# Patient Record
Sex: Male | Born: 1990 | Race: Black or African American | Hispanic: No | Marital: Single | Smoking: Current every day smoker
Health system: Southern US, Community
[De-identification: ages and names within clinical notes are randomized; demographics above are authoritative.]

## PROBLEM LIST (undated history)

## (undated) DIAGNOSIS — I10 Essential (primary) hypertension: Secondary | ICD-10-CM

## (undated) DIAGNOSIS — E109 Type 1 diabetes mellitus without complications: Secondary | ICD-10-CM

## (undated) DIAGNOSIS — M549 Dorsalgia, unspecified: Secondary | ICD-10-CM

## (undated) DIAGNOSIS — G8929 Other chronic pain: Secondary | ICD-10-CM

## (undated) DIAGNOSIS — S21339A Puncture wound without foreign body of unspecified front wall of thorax with penetration into thoracic cavity, initial encounter: Secondary | ICD-10-CM

## (undated) DIAGNOSIS — W3400XA Accidental discharge from unspecified firearms or gun, initial encounter: Secondary | ICD-10-CM

## (undated) HISTORY — DX: Essential (primary) hypertension: I10

## (undated) HISTORY — PX: ABDOMINAL SURGERY: SHX537

---
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
## 2009-11-29 ENCOUNTER — Emergency Department (HOSPITAL_COMMUNITY): Admission: EM | Admit: 2009-11-29 | Discharge: 2009-11-29 | Payer: Self-pay | Admitting: Emergency Medicine
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
## 2011-01-24 NOTE — Discharge Summary (Signed)
   NAME:  Andrew Morales, Andrew Morales                         ACCOUNT NO.:  [id number]   MEDICAL RECORD NO.:  [id number]                   PATIENT TYPE:  INP   LOCATION:  6153                                 FACILITY:  [facility]   PHYSICIAN:  Pablo Ledger, M.D.               DATE OF BIRTH:  1991/02/14   DATE OF ADMISSION:  11/04/2002  DATE OF DISCHARGE:  11/08/2002                                 DISCHARGE SUMMARY   DISCHARGE DIAGNOSES:  1. Type 2 diabetes mellitus.  2. Obesity.  3. Noncompliance.   DISCHARGE MEDICATIONS:  Glucophage 150 mg p.o. b.i.d.   DISCHARGE INSTRUCTIONS:  Instructions to check CBGs before meals.   FOLLOWUP:  The patient will follow up with Dr. Nani Gasser on March  10th at 3:30 p.m.   HOSPITAL COURSE:  [age]-year-old boy with a history of diabetes and  __________ history of polyuria.  The patient had been previously diagnosed  in 2001 at [facility] in [place] and was on insulin and  Glucophage.  Insulin was discontinued after a couple of weeks and the  patient was continued on Glucophage.  The patient decided at some point to  discontinue taking that medication and did not follow up with the primary  care Hilding Quintanar.  The patient's blood sugar on admission was 418 and the  patient was started with IV hydration and his pH was 7.388 at that time.  Glucophage was started at 500 mg p.o. b.i.d. and increased after several  days to 850 mg p.o. b.i.d. without any GI side-effects.  BUN and creatinine  were 10 and 0.8, respectively upon admission, and glycosylated hemoglobin  was 11.9 upon admission.  The patient did well with an increased Glucophage  regimen.  The patient was covered with NPH of 5 units in the a.m. and 5  units in the p.m. and was discontinued as of discharge and will follow up in  one week with CBGs.     Nani Gasser, M.D.                  Pablo Ledger, M.D.    CM/MEDQ  D:  11/08/2002  T:  11/08/2002  Job:  [id number]   cc:    Nani Gasser, M.D.  [address] [place], [place] [address]  Fax: [phone]

## 2011-04-20 ENCOUNTER — Emergency Department (HOSPITAL_COMMUNITY)
Admission: EM | Admit: 2011-04-20 | Discharge: 2011-04-20 | Disposition: A | Discharge status: Home / Self Care | Payer: Self-pay | Attending: Emergency Medicine | Admitting: Emergency Medicine

## 2011-04-20 DIAGNOSIS — L723 Sebaceous cyst: Secondary | ICD-10-CM | POA: Insufficient documentation

## 2011-04-20 DIAGNOSIS — F17200 Nicotine dependence, unspecified, uncomplicated: Secondary | ICD-10-CM | POA: Insufficient documentation

## 2011-04-20 MED ORDER — LIDOCAINE HCL 2 % IJ SOLN
INTRAMUSCULAR | Status: AC
  Filled 2011-04-20: qty 1

## 2011-04-20 MED ORDER — DOXYCYCLINE HYCLATE 100 MG PO CAPS: 100.0000 mg | ORAL_CAPSULE | Freq: Two times a day (BID) | ORAL | Status: AC

## 2011-04-20 MED ORDER — HYDROCODONE-ACETAMINOPHEN 5-325 MG PO TABS: ORAL_TABLET | ORAL | Status: DC

## 2011-04-20 NOTE — ED Provider Notes (Signed)
History     CSN: [id number] Arrival date & time: 04/20/2011  5:55 PM  Chief Complaint  Patient presents with  . Cyst  . Otalgia   Patient is a 20 y.o. male presenting with ear pain. The history is provided by the patient. No language interpreter was used.  Otalgia This is a new problem. There is pain in the left ear. The problem occurs constantly. The problem has been gradually worsening. There has been no fever. The pain is at a severity of 7/10. Pertinent negatives include no ear discharge. His past medical history does not include hearing loss.    History reviewed. No pertinent past medical history.  Past Surgical History  Procedure Date  . Abdominal surgery     No family history on file.  History  Substance Use Topics  . Smoking status: Current Everyday Smoker  . Smokeless tobacco: Not on file  . Alcohol Use: No      Review of Systems  HENT: Positive for ear pain. Negative for ear discharge.   All other systems reviewed and are negative.    Physical Exam  BP 147/84  Pulse 83  Temp(Src) 98.4 F (36.9 C) (Oral)  Resp 18  Ht 6\' 2"  (1.88 m)  Wt 214 lb (97.07 kg)  BMI 27.48 kg/m2  SpO2 100%  Physical Exam  Nursing note and vitals reviewed. Constitutional: He is oriented to person, place, and time. Vital signs are normal. He appears well-developed and well-nourished.  HENT:  Head: Normocephalic and atraumatic.  Right Ear: External ear normal.  Left Ear: Hearing normal. There is tenderness. No drainage or swelling. No decreased hearing is noted.  Ears:  Nose: Nose normal.  Mouth/Throat: No oropharyngeal exudate.       ~ 1 cm diam area of swelling to L auditory meatus.  + PT  Eyes: Conjunctivae and EOM are normal. Pupils are equal, round, and reactive to light. Right eye exhibits no discharge. Left eye exhibits no discharge. No scleral icterus.  Neck: Normal range of motion. Neck supple. No JVD present. No tracheal deviation present. No thyromegaly present.    Cardiovascular: Normal rate, regular rhythm, normal heart sounds, intact distal pulses and normal pulses.  Exam reveals no gallop and no friction rub.   No murmur heard. Pulmonary/Chest: Effort normal and breath sounds normal. No stridor. No respiratory distress. He has no wheezes. He has no rales. He exhibits no tenderness.  Abdominal: Soft. Normal appearance and bowel sounds are normal. He exhibits no distension and no mass. There is no tenderness. There is no rebound and no guarding.  Musculoskeletal: Normal range of motion. He exhibits no edema and no tenderness.  Lymphadenopathy:    He has no cervical adenopathy.  Neurological: He is alert and oriented to person, place, and time. He has normal reflexes. No cranial nerve deficit. Coordination normal. GCS eye subscore is 4. GCS verbal subscore is 5. GCS motor subscore is 6.  Reflex Scores:      Tricep reflexes are 2+ on the right side and 2+ on the left side.      Bicep reflexes are 2+ on the right side and 2+ on the left side.      Brachioradialis reflexes are 2+ on the right side and 2+ on the left side.      Patellar reflexes are 2+ on the right side and 2+ on the left side.      Achilles reflexes are 2+ on the right side and 2+ on the left  side. Skin: Skin is warm and dry. No rash noted. He is not diaphoretic.  Psychiatric: He has a normal mood and affect. His speech is normal and behavior is normal. Judgment and thought content normal. Cognition and memory are normal.    ED Course  INCISION AND DRAINAGE Date/Time: 04/20/2011 6:40 PM Performed by: Worthy Rancher Authorized by: Geoffery Lyons Consent: Verbal consent obtained. Written consent not obtained. Risks and benefits: risks, benefits and alternatives were discussed Consent given by: patient Patient understanding: patient states understanding of the procedure being performed Site marked: the operative site was not marked Patient identity confirmed: verbally with  patient Time out: Immediately prior to procedure a "time out" was called to verify the correct patient, procedure, equipment, support staff and site/side marked as required. Type: cyst Location: L auditory meatus. Anesthesia: local infiltration Local anesthetic: lidocaine 2% without epinephrine Anesthetic total: 1 ml Patient sedated: no Scalpel size: 11 Needle gauge: 25 ga. Incision type: single straight Complexity: simple Drainage: purulent Drainage amount: moderate Wound treatment: wound left open Patient tolerance: Patient tolerated the procedure well with no immediate complications.    MDM       Worthy Rancher, PA 04/20/11 1905

## 2011-04-20 NOTE — ED Notes (Signed)
Pt reports a cyst to his left ear.  Reports that it "popped up last night".  Pt reports severe pain to the area.

## 2011-04-20 NOTE — ED Provider Notes (Signed)
Medical screening examination/treatment/procedure(s) were performed by non-physician practitioner and as supervising physician I was immediately available for consultation/collaboration.  Brindy Higginbotham, MD 04/20/11 1928 

## 2011-06-06 LAB — GLUCOSE, CAPILLARY
Glucose-Capillary: 219 — ABNORMAL HIGH
Glucose-Capillary: 233 — ABNORMAL HIGH
Glucose-Capillary: 248 — ABNORMAL HIGH
Glucose-Capillary: 294 — ABNORMAL HIGH

## 2011-06-06 LAB — POCT I-STAT, CHEM 8
BUN: 14
Calcium, Ion: 1.07 — ABNORMAL LOW
TCO2: 20

## 2011-06-06 LAB — TYPE AND SCREEN
ABO/RH(D): B POS
Antibody Screen: NEGATIVE

## 2011-06-06 LAB — ABO/RH: ABO/RH(D): B POS

## 2011-06-06 LAB — CBC
HCT: 40.8
Hemoglobin: 14
MCV: 92.2
RBC: 4.43
WBC: 12.8

## 2011-06-06 LAB — URINE MICROSCOPIC-ADD ON

## 2011-06-06 LAB — BASIC METABOLIC PANEL
Chloride: 99
Potassium: 3.9
Sodium: 134 — ABNORMAL LOW

## 2011-06-06 LAB — URINALYSIS, ROUTINE W REFLEX MICROSCOPIC
Bilirubin Urine: NEGATIVE
Glucose, UA: >1000 — AB
Ketones, ur: 40 — AB
Leukocytes, UA: NEGATIVE
Protein, ur: 30 — AB

## 2011-06-06 LAB — RAPID URINE DRUG SCREEN, HOSP PERFORMED
Barbiturates: NOT DETECTED
Cocaine: NOT DETECTED
Opiates: NOT DETECTED
Tetrahydrocannabinol: POSITIVE — AB

## 2011-11-19 ENCOUNTER — Emergency Department (HOSPITAL_COMMUNITY)
Admission: EM | Admit: 2011-11-19 | Discharge: 2011-11-19 | Disposition: A | Discharge status: Home / Self Care | Payer: Self-pay | Attending: Emergency Medicine | Admitting: Emergency Medicine

## 2011-11-19 ENCOUNTER — Encounter (HOSPITAL_COMMUNITY): Payer: Self-pay | Admitting: *Deleted

## 2011-11-19 DIAGNOSIS — M79609 Pain in unspecified limb: Secondary | ICD-10-CM | POA: Insufficient documentation

## 2011-11-19 DIAGNOSIS — M7989 Other specified soft tissue disorders: Secondary | ICD-10-CM | POA: Insufficient documentation

## 2011-11-19 DIAGNOSIS — IMO0002 Reserved for concepts with insufficient information to code with codable children: Secondary | ICD-10-CM | POA: Insufficient documentation

## 2011-11-19 DIAGNOSIS — L0291 Cutaneous abscess, unspecified: Secondary | ICD-10-CM

## 2011-11-19 DIAGNOSIS — R599 Enlarged lymph nodes, unspecified: Secondary | ICD-10-CM | POA: Insufficient documentation

## 2011-11-19 MED ORDER — LIDOCAINE-EPINEPHRINE (PF) 1 %-1:200000 IJ SOLN
10.0000 mL | Freq: Once | INTRAMUSCULAR | Status: AC
  Administered 2011-11-19: 10 mL
  Filled 2011-11-19: qty 10

## 2011-11-19 MED ORDER — OXYCODONE-ACETAMINOPHEN 5-325 MG PO TABS
1.0000 | ORAL_TABLET | Freq: Once | ORAL | Status: AC
  Administered 2011-11-19: 1 via ORAL
  Filled 2011-11-19: qty 1

## 2011-11-19 MED ORDER — DOXYCYCLINE HYCLATE 100 MG PO CAPS: 100.0000 mg | ORAL_CAPSULE | Freq: Two times a day (BID) | ORAL | Status: AC

## 2011-11-19 MED ORDER — OXYCODONE-ACETAMINOPHEN 5-325 MG PO TABS: 1.0000 | ORAL_TABLET | ORAL | Status: AC | PRN

## 2011-11-19 NOTE — ED Notes (Signed)
Swollen area lt axilla.x 4 days

## 2011-11-19 NOTE — ED Provider Notes (Signed)
History     CSN: [id number]  Arrival date & time 11/19/11  1543   First MD Initiated Contact with Patient 11/19/11 1558      Chief Complaint  Patient presents with  . Abscess    (Consider location/radiation/quality/duration/timing/severity/associated sxs/prior treatment) HPI Comments: Patient presents with an abscess to his left axilla which is been present for the past 4 days.  It has not been draining, but he reports increasing pain swelling and redness which is now spreading to his upper arm.  He does have a history of a similar infection with an incision and drainage procedure last fall to his left ear.  Patient is a 21 y.o. male presenting with abscess. The history is provided by the patient.  Abscess  This is a new problem. The current episode started less than one week ago. The onset was gradual. The problem occurs continuously. The problem has been gradually worsening. The abscess is present on the left arm. The problem is moderate. The abscess is characterized by painfulness and swelling. Pertinent negatives include no fever, no vomiting, no congestion, no sore throat and no cough.    History reviewed. No pertinent past medical history.  Past Surgical History  Procedure Date  . Abdominal surgery     History reviewed. No pertinent family history.  History  Substance Use Topics  . Smoking status: Current Everyday Smoker  . Smokeless tobacco: Not on file  . Alcohol Use: No      Review of Systems  Constitutional: Negative for fever.  HENT: Negative for congestion, sore throat and neck pain.   Eyes: Negative.   Respiratory: Negative for cough, chest tightness and shortness of breath.   Cardiovascular: Negative for chest pain.  Gastrointestinal: Negative for nausea, vomiting and abdominal pain.  Genitourinary: Negative.   Musculoskeletal: Negative for joint swelling and arthralgias.  Skin: Positive for color change and wound. Negative for rash.  Neurological:  Negative for dizziness, weakness, light-headedness, numbness and headaches.  Hematological: Negative.   Psychiatric/Behavioral: Negative.     Allergies  Bee venom  Home Medications   Current Outpatient Rx  Name Route Sig Dispense Refill  . DOXYCYCLINE HYCLATE 100 MG PO CAPS Oral Take 1 capsule (100 mg total) by mouth 2 (two) times daily. 20 capsule 0  . HYDROCODONE-ACETAMINOPHEN 5-325 MG PO TABS  One po q 4-6 hrs prn pain 12 tablet 0  . OXYCODONE-ACETAMINOPHEN 5-325 MG PO TABS Oral Take 1 tablet by mouth every 4 (four) hours as needed for pain. 15 tablet 0    BP 140/79  Pulse 93  Temp(Src) 98.6 F (37 C) (Oral)  Resp 18  Ht 6\' 2"  (1.88 m)  Wt 202 lb (91.627 kg)  BMI 25.94 kg/m2  SpO2 100%  Physical Exam  Nursing note and vitals reviewed. Constitutional: He is oriented to person, place, and time. He appears well-developed and well-nourished.  HENT:  Head: Normocephalic and atraumatic.  Eyes: Conjunctivae are normal.  Neck: Normal range of motion.  Cardiovascular: Normal rate, regular rhythm, normal heart sounds and intact distal pulses.   Pulmonary/Chest: Effort normal and breath sounds normal. He has no wheezes.  Abdominal: Soft. Bowel sounds are normal. There is no tenderness.  Musculoskeletal: Normal range of motion.  Lymphadenopathy:    He has axillary adenopathy.       Left axillary: Lateral adenopathy present.       Left: No supraclavicular adenopathy present.  Neurological: He is alert and oriented to person, place, and time.  Skin: Skin  is warm and dry. There is erythema.       2 cm fluctuant very circumcised raised abscess left axilla with erythema noted on the medial left upper arm which ends just superior to elbow.  Psychiatric: He has a normal mood and affect.    ED Course  INCISION AND DRAINAGE Date/Time: 11/19/2011 4:20 PM Performed by: Jahni Paul L Authorized by: Candis Musa Consent: Verbal consent obtained. Risks and benefits: risks, benefits and  alternatives were discussed Consent given by: patient Time out: Immediately prior to procedure a "time out" was called to verify the correct patient, procedure, equipment, support staff and site/side marked as required. Type: abscess Body area: upper extremity Location details: left arm Anesthesia: local infiltration Local anesthetic: lidocaine 1% with epinephrine Anesthetic total: 6 ml Risk factor: underlying major vessel Scalpel size: 11 Incision type: single straight Complexity: simple Drainage: purulent Drainage amount: moderate Packing material: 1/4 in gauze Patient tolerance: Patient tolerated the procedure well with no immediate complications.   (including critical care time)   Labs Reviewed  WOUND CULTURE  CULTURE, ROUTINE-ABSCESS     1. Abscess and cellulitis       MDM  Patient to return in 3 days for packing removal and recheck of cellulitis.  Dressing was applied RN prior to discharge home.  He has been prescribed doxycycline and also Percocet for pain relief.  Wound culture was obtained and sent.        Candis Musa, PA 11/19/11 1738

## 2011-11-19 NOTE — Discharge Instructions (Signed)
Abscess An abscess (boil or furuncle) is an infected area that contains a collection of pus.  SYMPTOMS Signs and symptoms of an abscess include pain, tenderness, redness, or hardness. You may feel a moveable soft area under your skin. An abscess can occur anywhere in the body.  TREATMENT  A surgical cut (incision) may be made over your abscess to drain the pus. Gauze may be packed into the space or a drain may be looped through the abscess cavity (pocket). This provides a drain that will allow the cavity to heal from the inside outwards. The abscess may be painful for a few days, but should feel much better if it was drained.  Your abscess, if seen early, may not have localized and may not have been drained. If not, another appointment may be required if it does not get better on its own or with medications. HOME CARE INSTRUCTIONS   Only take over-the-counter or prescription medicines for pain, discomfort, or fever as directed by your caregiver.   Take your antibiotics as directed if they were prescribed. Finish them even if you start to feel better.   Keep the skin and clothes clean around your abscess.   If the abscess was drained, you will need to use gauze dressing to collect any draining pus. Dressings will typically need to be changed 3 or more times a day.   The infection may spread by skin contact with others. Avoid skin contact as much as possible.   Practice good hygiene. This includes regular hand washing, cover any draining skin lesions, and do not share personal care items.   If you participate in sports, do not share athletic equipment, towels, whirlpools, or personal care items. Shower after every practice or tournament.   If a draining area cannot be adequately covered:   Do not participate in sports.   Children should not participate in day care until the wound has healed or drainage stops.   If your caregiver has given you a follow-up appointment, it is very important  to keep that appointment. Not keeping the appointment could result in a much worse infection, chronic or permanent injury, pain, and disability. If there is any problem keeping the appointment, you must call back to this facility for assistance.  SEEK MEDICAL CARE IF:   You develop increased pain, swelling, redness, drainage, or bleeding in the wound site.   You develop signs of generalized infection including muscle aches, chills, fever, or a general ill feeling.   You have an oral temperature above 102 F (38.9 C).  MAKE SURE YOU:   Understand these instructions.   Will watch your condition.   Will get help right away if you are not doing well or get worse.  Document Released: 06/04/2005 Document Revised: 08/14/2011 Document Reviewed: 03/28/2008 [facility] Patient Information 2012 [place], [place]. Return here in 3 days to have your packing removed and for a recheck of your infection.  Take your medications as instructed.  You may take the oxycodone prescribed for pain relief.  This will make you drowsy - do not drive within 4 hours of taking this medication.

## 2011-11-19 NOTE — ED Notes (Signed)
Swelling and redness left axilla

## 2011-11-19 NOTE — ED Notes (Signed)
i and d with packing inserted by J.IdoL PA

## 2011-11-20 NOTE — ED Provider Notes (Signed)
Medical screening examination/treatment/procedure(s) were performed by non-physician practitioner and as supervising physician I was immediately available for consultation/collaboration.   Rain Wilhide L Helayna Dun, MD 11/20/11 1604 

## 2011-11-21 ENCOUNTER — Encounter (HOSPITAL_COMMUNITY): Payer: Self-pay

## 2011-11-21 ENCOUNTER — Emergency Department (HOSPITAL_COMMUNITY)
Admission: EM | Admit: 2011-11-21 | Discharge: 2011-11-21 | Disposition: A | Discharge status: Home / Self Care | Payer: Self-pay | Attending: Emergency Medicine | Admitting: Emergency Medicine

## 2011-11-21 DIAGNOSIS — L0291 Cutaneous abscess, unspecified: Secondary | ICD-10-CM

## 2011-11-21 DIAGNOSIS — Z4800 Encounter for change or removal of nonsurgical wound dressing: Secondary | ICD-10-CM | POA: Insufficient documentation

## 2011-11-21 DIAGNOSIS — F17200 Nicotine dependence, unspecified, uncomplicated: Secondary | ICD-10-CM | POA: Insufficient documentation

## 2011-11-21 DIAGNOSIS — IMO0002 Reserved for concepts with insufficient information to code with codable children: Secondary | ICD-10-CM | POA: Insufficient documentation

## 2011-11-21 NOTE — Discharge Instructions (Signed)
Abscess An abscess (boil or furuncle) is an infected area that contains a collection of pus.  SYMPTOMS Signs and symptoms of an abscess include pain, tenderness, redness, or hardness. You may feel a moveable soft area under your skin. An abscess can occur anywhere in the body.  TREATMENT  A surgical cut (incision) may be made over your abscess to drain the pus. Gauze may be packed into the space or a drain may be looped through the abscess cavity (pocket). This provides a drain that will allow the cavity to heal from the inside outwards. The abscess may be painful for a few days, but should feel much better if it was drained.  Your abscess, if seen early, may not have localized and may not have been drained. If not, another appointment may be required if it does not get better on its own or with medications. HOME CARE INSTRUCTIONS   Only take over-the-counter or prescription medicines for pain, discomfort, or fever as directed by your caregiver.   Take your antibiotics as directed if they were prescribed. Finish them even if you start to feel better.   Keep the skin and clothes clean around your abscess.   If the abscess was drained, you will need to use gauze dressing to collect any draining pus. Dressings will typically need to be changed 3 or more times a day.   The infection may spread by skin contact with others. Avoid skin contact as much as possible.   Practice good hygiene. This includes regular hand washing, cover any draining skin lesions, and do not share personal care items.   If you participate in sports, do not share athletic equipment, towels, whirlpools, or personal care items. Shower after every practice or tournament.   If a draining area cannot be adequately covered:   Do not participate in sports.   Children should not participate in day care until the wound has healed or drainage stops.   If your caregiver has given you a follow-up appointment, it is very important  to keep that appointment. Not keeping the appointment could result in a much worse infection, chronic or permanent injury, pain, and disability. If there is any problem keeping the appointment, you must call back to this facility for assistance.  SEEK MEDICAL CARE IF:   You develop increased pain, swelling, redness, drainage, or bleeding in the wound site.   You develop signs of generalized infection including muscle aches, chills, fever, or a general ill feeling.   You have an oral temperature above 102 F (38.9 C).  MAKE SURE YOU:   Understand these instructions.   Will watch your condition.   Will get help right away if you are not doing well or get worse.  Document Released: 06/04/2005 Document Revised: 08/14/2011 Document Reviewed: 03/28/2008 Exit[employer] Patient Information 2012 [employer].Abscess Care After An abscess (also called a boil or furuncle) is an infected area that contains a collection of pus. Signs and symptoms of an abscess include pain, tenderness, redness, or hardness, or you may feel a moveable soft area under your skin. An abscess can occur anywhere in the body. The infection may spread to surrounding tissues causing cellulitis. A cut (incision) by the surgeon was made over your abscess and the pus was drained out. Gauze may have been packed into the space to provide a drain that will allow the cavity to heal from the inside outwards. The boil may be painful for 5 to 7 days. Most people with a boil   do not have high fevers. Your abscess, if seen early, may not have localized, and may not have been lanced. If not, another appointment may be required for this if it does not get better on its own or with medications. HOME CARE INSTRUCTIONS   Only take over-the-counter or prescription medicines for pain, discomfort, or fever as directed by your caregiver.   When you bathe, soak and then remove gauze or iodoform packs at least daily or as directed by your caregiver. You may  then wash the wound gently with mild soapy water. Repack with gauze or do as your caregiver directs.  SEEK IMMEDIATE MEDICAL CARE IF:   You develop increased pain, swelling, redness, drainage, or bleeding in the wound site.   You develop signs of generalized infection including muscle aches, chills, fever, or a general ill feeling.   An oral temperature above 102 F (38.9 C) develops, not controlled by medication.  See your caregiver for a recheck if you develop any of the symptoms described above. If medications (antibiotics) were prescribed, take them as directed. Document Released: 03/13/2005 Document Revised: 08/14/2011 Document Reviewed: 11/08/2007 Exit[employer] Patient Information 2012 [employer]. 

## 2011-11-21 NOTE — ED Provider Notes (Signed)
History     CSN: [id number]  Arrival date & time 11/21/11  1831   First MD Initiated Contact with Patient 11/21/11 1911      Chief Complaint  Patient presents with  . Wound Check    (Consider location/radiation/quality/duration/timing/severity/associated sxs/prior treatment) HPI Comments: Patient returns to ED requesting recheck of an abscess and to have previously placed packing removed.  States the pain is improving and previous redness has greatly improved  Patient is a 21 y.o. male presenting with wound check. The history is provided by the patient. No language interpreter was used.  Wound Check  He was treated in the ED 2 to 3 days ago. Previous treatment in the ED includes I&D of abscess and oral antibiotics. Treatments since wound repair include oral antibiotics and regular soap and water washings. Fever duration: no fever. There has been colored discharge from the wound. The redness has improved. The swelling has improved. The pain has improved. He has no difficulty moving the affected extremity or digit.    History reviewed. No pertinent past medical history.  Past Surgical History  Procedure Date  . Abdominal surgery     No family history on file.  History  Substance Use Topics  . Smoking status: Current Everyday Smoker    Types: Cigarettes  . Smokeless tobacco: Not on file  . Alcohol Use: No      Review of Systems  Constitutional: Negative for fever and chills.  Gastrointestinal: Negative for nausea and vomiting.  Skin: Positive for wound. Negative for color change.  Hematological: Negative for adenopathy.  All other systems reviewed and are negative.    Allergies  Bee venom  Home Medications   Current Outpatient Rx  Name Route Sig Dispense Refill  . DOXYCYCLINE HYCLATE 100 MG PO CAPS Oral Take 1 capsule (100 mg total) by mouth 2 (two) times daily. 20 capsule 0  . OXYCODONE-ACETAMINOPHEN 5-325 MG PO TABS Oral Take 1 tablet by mouth every 4 (four)  hours as needed for pain. 15 tablet 0    BP 140/73  Pulse 92  Temp(Src) 98.7 F (37.1 C) (Oral)  Resp 16  Ht 6\' 2"  (1.88 m)  Wt 202 lb 12.8 oz (91.989 kg)  BMI 26.04 kg/m2  SpO2 99%  Physical Exam  Nursing note and vitals reviewed. Constitutional: He is oriented to person, place, and time. He appears well-developed and well-nourished. No distress.  HENT:  Head: Normocephalic and atraumatic.  Cardiovascular: Normal rate, regular rhythm and normal heart sounds.   Pulmonary/Chest: Effort normal and breath sounds normal. No respiratory distress.  Musculoskeletal: Normal range of motion. He exhibits no edema and no tenderness.  Neurological: He is alert and oriented to person, place, and time. He exhibits normal muscle tone. Coordination normal.  Skin: Skin is warm and dry.       Abscess to left axilla with previous I&D performed.  Packing in place with mild purulent drainage on the dressing.  Appears to be improving    ED Course  Procedures (including critical care time)   1. Abscess     I have removed the packing without difficulty. Patient tolerated procedure well.  Abscess is improving  MDM    Abscess seems to be improving. Erythema has improved  based on the skin marks made on previous visit.  Patient currently taking doxycycline have advised him to continue until medication course is completed   Patient / Family / Caregiver understand and agree with initial ED impression and plan with expectations  set for ED visit. Pt stable in ED with no significant deterioration in condition. Pt feels improved after observation and/or treatment in ED.    Medical screening examination/treatment/procedure(s) were performed by non-physician practitioner and as supervising physician I was immediately available for consultation/collaboration. Osvaldo Human, M.D.    Tammy L. Holton, [place] 11/24/11 1703  Carleene Cooper III, MD 11/25/11 1141

## 2011-11-21 NOTE — ED Notes (Signed)
Pt here for packing removal from left axillary

## 2011-11-22 LAB — CULTURE, ROUTINE-ABSCESS

## 2012-05-05 ENCOUNTER — Emergency Department (HOSPITAL_COMMUNITY): Payer: Self-pay

## 2012-05-05 ENCOUNTER — Encounter (HOSPITAL_COMMUNITY): Payer: Self-pay | Admitting: Emergency Medicine

## 2012-05-05 ENCOUNTER — Other Ambulatory Visit: Payer: Self-pay

## 2012-05-05 ENCOUNTER — Emergency Department (HOSPITAL_COMMUNITY)
Admission: EM | Admit: 2012-05-05 | Discharge: 2012-05-05 | Disposition: A | Discharge status: Home / Self Care | Payer: Self-pay | Attending: Emergency Medicine | Admitting: Emergency Medicine

## 2012-05-05 DIAGNOSIS — R059 Cough, unspecified: Secondary | ICD-10-CM | POA: Insufficient documentation

## 2012-05-05 DIAGNOSIS — Z7982 Long term (current) use of aspirin: Secondary | ICD-10-CM | POA: Insufficient documentation

## 2012-05-05 DIAGNOSIS — R079 Chest pain, unspecified: Secondary | ICD-10-CM | POA: Insufficient documentation

## 2012-05-05 DIAGNOSIS — M545 Low back pain, unspecified: Secondary | ICD-10-CM | POA: Insufficient documentation

## 2012-05-05 DIAGNOSIS — G8929 Other chronic pain: Secondary | ICD-10-CM

## 2012-05-05 DIAGNOSIS — F17200 Nicotine dependence, unspecified, uncomplicated: Secondary | ICD-10-CM | POA: Insufficient documentation

## 2012-05-05 DIAGNOSIS — R05 Cough: Secondary | ICD-10-CM | POA: Insufficient documentation

## 2012-05-05 HISTORY — DX: Puncture wound without foreign body of unspecified front wall of thorax with penetration into thoracic cavity, initial encounter: S21.339A

## 2012-05-05 HISTORY — DX: Dorsalgia, unspecified: M54.9

## 2012-05-05 HISTORY — DX: Accidental discharge from unspecified firearms or gun, initial encounter: W34.00XA

## 2012-05-05 HISTORY — DX: Other chronic pain: G89.29

## 2012-05-05 LAB — URINALYSIS, ROUTINE W REFLEX MICROSCOPIC
Glucose, UA: 500 mg/dL — AB
Hgb urine dipstick: NEGATIVE
Protein, ur: 30 mg/dL — AB
Urobilinogen, UA: 0.2 mg/dL (ref 0.0–1.0)

## 2012-05-05 LAB — CBC WITH DIFFERENTIAL/PLATELET
Basophils Absolute: 0 10*3/uL (ref 0.0–0.1)
Basophils Relative: 0 % (ref 0–1)
Eosinophils Absolute: 0 10*3/uL (ref 0.0–0.7)
Hemoglobin: 14.2 g/dL (ref 13.0–17.0)
MCH: 30.9 pg (ref 26.0–34.0)
MCHC: 35.4 g/dL (ref 30.0–36.0)
Monocytes Absolute: 0.2 10*3/uL (ref 0.1–1.0)
Monocytes Relative: 4 % (ref 3–12)
Neutrophils Relative %: 69 % (ref 43–77)
RDW: 12.1 % (ref 11.5–15.5)

## 2012-05-05 LAB — BASIC METABOLIC PANEL
BUN: 19 mg/dL (ref 6–23)
Creatinine, Ser: 0.83 mg/dL (ref 0.50–1.35)
GFR calc Af Amer: >90 mL/min (ref >90)
GFR calc non Af Amer: >90 mL/min (ref >90)
Glucose, Bld: 274 mg/dL — ABNORMAL HIGH (ref 70–99)
Potassium: 4.1 mEq/L (ref 3.5–5.1)

## 2012-05-05 LAB — URINE MICROSCOPIC-ADD ON

## 2012-05-05 MED ORDER — OXYCODONE-ACETAMINOPHEN 5-325 MG PO TABS
1.0000 | ORAL_TABLET | Freq: Once | ORAL | Status: AC
  Administered 2012-05-05: 1 via ORAL
  Filled 2012-05-05: qty 1

## 2012-05-05 MED ORDER — METHOCARBAMOL 500 MG PO TABS: 1000.0000 mg | ORAL_TABLET | Freq: Four times a day (QID) | ORAL | Status: AC | PRN

## 2012-05-05 MED ORDER — IBUPROFEN 400 MG PO TABS
400.0000 mg | ORAL_TABLET | Freq: Once | ORAL | Status: AC
  Administered 2012-05-05: 400 mg via ORAL
  Filled 2012-05-05: qty 1

## 2012-05-05 MED ORDER — HYDROCODONE-ACETAMINOPHEN 5-325 MG PO TABS: ORAL_TABLET | ORAL | Status: AC
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
## 2012-05-07 LAB — URINE CULTURE: Colony Count: 5000

## 2013-03-25 ENCOUNTER — Inpatient Hospital Stay (HOSPITAL_COMMUNITY)
Admission: EM | Admit: 2013-03-25 | Discharge: 2013-03-27 | DRG: 641 | Payer: PRIVATE HEALTH INSURANCE | Attending: Internal Medicine | Admitting: Internal Medicine

## 2013-03-25 ENCOUNTER — Encounter (HOSPITAL_COMMUNITY): Payer: Self-pay | Admitting: *Deleted

## 2013-03-25 DIAGNOSIS — N289 Disorder of kidney and ureter, unspecified: Secondary | ICD-10-CM | POA: Diagnosis present

## 2013-03-25 DIAGNOSIS — I10 Essential (primary) hypertension: Secondary | ICD-10-CM

## 2013-03-25 DIAGNOSIS — K920 Hematemesis: Secondary | ICD-10-CM | POA: Diagnosis present

## 2013-03-25 DIAGNOSIS — E119 Type 2 diabetes mellitus without complications: Secondary | ICD-10-CM | POA: Diagnosis present

## 2013-03-25 DIAGNOSIS — R112 Nausea with vomiting, unspecified: Secondary | ICD-10-CM | POA: Diagnosis present

## 2013-03-25 DIAGNOSIS — K2970 Gastritis, unspecified, without bleeding: Secondary | ICD-10-CM | POA: Diagnosis present

## 2013-03-25 DIAGNOSIS — K2990 Gastroduodenitis, unspecified, without bleeding: Secondary | ICD-10-CM | POA: Diagnosis present

## 2013-03-25 DIAGNOSIS — E669 Obesity, unspecified: Secondary | ICD-10-CM

## 2013-03-25 DIAGNOSIS — E109 Type 1 diabetes mellitus without complications: Secondary | ICD-10-CM | POA: Diagnosis present

## 2013-03-25 DIAGNOSIS — E871 Hypo-osmolality and hyponatremia: Secondary | ICD-10-CM

## 2013-03-25 DIAGNOSIS — IMO0001 Reserved for inherently not codable concepts without codable children: Secondary | ICD-10-CM | POA: Diagnosis present

## 2013-03-25 DIAGNOSIS — E860 Dehydration: Secondary | ICD-10-CM | POA: Diagnosis present

## 2013-03-25 DIAGNOSIS — R1010 Upper abdominal pain, unspecified: Secondary | ICD-10-CM

## 2013-03-25 DIAGNOSIS — K529 Noninfective gastroenteritis and colitis, unspecified: Secondary | ICD-10-CM

## 2013-03-25 DIAGNOSIS — M549 Dorsalgia, unspecified: Secondary | ICD-10-CM | POA: Diagnosis present

## 2013-03-25 DIAGNOSIS — R809 Proteinuria, unspecified: Secondary | ICD-10-CM

## 2013-03-25 DIAGNOSIS — E1065 Type 1 diabetes mellitus with hyperglycemia: Secondary | ICD-10-CM | POA: Diagnosis present

## 2013-03-25 DIAGNOSIS — E7800 Pure hypercholesterolemia, unspecified: Secondary | ICD-10-CM

## 2013-03-25 DIAGNOSIS — IMO0002 Reserved for concepts with insufficient information to code with codable children: Secondary | ICD-10-CM | POA: Diagnosis present

## 2013-03-25 DIAGNOSIS — R739 Hyperglycemia, unspecified: Secondary | ICD-10-CM

## 2013-03-25 DIAGNOSIS — R1110 Vomiting, unspecified: Secondary | ICD-10-CM

## 2013-03-25 DIAGNOSIS — F17200 Nicotine dependence, unspecified, uncomplicated: Secondary | ICD-10-CM | POA: Diagnosis present

## 2013-03-25 DIAGNOSIS — Z833 Family history of diabetes mellitus: Secondary | ICD-10-CM

## 2013-03-25 DIAGNOSIS — N179 Acute kidney failure, unspecified: Secondary | ICD-10-CM | POA: Diagnosis present

## 2013-03-25 DIAGNOSIS — Z720 Tobacco use: Secondary | ICD-10-CM | POA: Diagnosis present

## 2013-03-25 DIAGNOSIS — G8929 Other chronic pain: Secondary | ICD-10-CM | POA: Diagnosis present

## 2013-03-25 DIAGNOSIS — K5289 Other specified noninfective gastroenteritis and colitis: Secondary | ICD-10-CM | POA: Diagnosis present

## 2013-03-25 HISTORY — DX: Type 1 diabetes mellitus without complications: E10.9

## 2013-03-25 LAB — CBC WITH DIFFERENTIAL/PLATELET
Basophils Relative: 0 % (ref 0–1)
Hemoglobin: 18.4 g/dL — ABNORMAL HIGH (ref 13.0–17.0)
Lymphs Abs: 1.4 10*3/uL (ref 0.7–4.0)
Monocytes Relative: 10 % (ref 3–12)
Neutro Abs: 3.7 10*3/uL (ref 1.7–7.7)
Neutrophils Relative %: 65 % (ref 43–77)
Platelets: 233 10*3/uL (ref 150–400)
RBC: 5.73 MIL/uL (ref 4.22–5.81)

## 2013-03-25 LAB — BASIC METABOLIC PANEL
BUN: 48 mg/dL — ABNORMAL HIGH (ref 6–23)
Chloride: 76 mEq/L — ABNORMAL LOW (ref 96–112)
GFR calc Af Amer: 84 mL/min — ABNORMAL LOW (ref >90)
GFR calc non Af Amer: 73 mL/min — ABNORMAL LOW (ref >90)
Glucose, Bld: 425 mg/dL — ABNORMAL HIGH (ref 70–99)
Potassium: 5 mEq/L (ref 3.5–5.1)
Sodium: 125 mEq/L — ABNORMAL LOW (ref 135–145)

## 2013-03-25 LAB — URINALYSIS, ROUTINE W REFLEX MICROSCOPIC
Bilirubin Urine: NEGATIVE
Nitrite: NEGATIVE
Specific Gravity, Urine: 1.025 (ref 1.005–1.030)
Urobilinogen, UA: 0.2 mg/dL (ref 0.0–1.0)
pH: 6 (ref 5.0–8.0)

## 2013-03-25 LAB — BLOOD GAS, VENOUS
Bicarbonate: 28.5 mEq/L — ABNORMAL HIGH (ref 20.0–24.0)
O2 Saturation: 70.8 %
TCO2: 23.9 mmol/L (ref 0–100)
pCO2, Ven: 39.8 mmHg — ABNORMAL LOW (ref 45.0–50.0)
pO2, Ven: 38.2 mmHg (ref 30.0–45.0)

## 2013-03-25 LAB — GLUCOSE, CAPILLARY: Glucose-Capillary: 316 mg/dL — ABNORMAL HIGH (ref 70–99)

## 2013-03-25 MED ORDER — SODIUM CHLORIDE 0.9 % IV SOLN
1000.0000 mL | INTRAVENOUS | Status: DC
  Administered 2013-03-25: 1000 mL via INTRAVENOUS

## 2013-03-25 MED ORDER — SODIUM CHLORIDE 0.9 % IV SOLN
1000.0000 mL | Freq: Once | INTRAVENOUS | Status: AC
  Administered 2013-03-25: 1000 mL via INTRAVENOUS

## 2013-03-25 NOTE — ED Notes (Signed)
Pt is from [facility] jail and has reported that he has been vomiting all week and unable to keep meds down. Jail reports a weight loss of 18lbs in 13 days. Jail wants pt checked  For DKA.

## 2013-03-25 NOTE — ED Notes (Signed)
Pt c/o generalized abdominal pain as well as N/V since Sunday night. Pt denies diarrhea.Pt states he has been unable to keep any food/drink down since symptoms began. Pt states he was switched to a liquid diet but no changes in symptoms.
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
## 2013-03-26 ENCOUNTER — Observation Stay (HOSPITAL_COMMUNITY): Payer: PRIVATE HEALTH INSURANCE

## 2013-03-26 ENCOUNTER — Emergency Department (HOSPITAL_COMMUNITY): Payer: PRIVATE HEALTH INSURANCE

## 2013-03-26 ENCOUNTER — Encounter (HOSPITAL_COMMUNITY): Payer: Self-pay | Admitting: Internal Medicine

## 2013-03-26 ENCOUNTER — Other Ambulatory Visit: Payer: Self-pay

## 2013-03-26 DIAGNOSIS — R7309 Other abnormal glucose: Secondary | ICD-10-CM

## 2013-03-26 DIAGNOSIS — E119 Type 2 diabetes mellitus without complications: Secondary | ICD-10-CM

## 2013-03-26 DIAGNOSIS — R1010 Upper abdominal pain, unspecified: Secondary | ICD-10-CM | POA: Diagnosis present

## 2013-03-26 DIAGNOSIS — K920 Hematemesis: Secondary | ICD-10-CM | POA: Diagnosis present

## 2013-03-26 DIAGNOSIS — E860 Dehydration: Secondary | ICD-10-CM | POA: Diagnosis present

## 2013-03-26 DIAGNOSIS — E871 Hypo-osmolality and hyponatremia: Principal | ICD-10-CM

## 2013-03-26 DIAGNOSIS — R112 Nausea with vomiting, unspecified: Secondary | ICD-10-CM

## 2013-03-26 LAB — HEPATIC FUNCTION PANEL
ALT: 16 U/L (ref 0–53)
AST: 15 U/L (ref 0–37)
Albumin: 4.8 g/dL (ref 3.5–5.2)
Alkaline Phosphatase: 117 U/L (ref 39–117)
Indirect Bilirubin: 0.9 mg/dL (ref 0.3–0.9)
Total Protein: 9 g/dL — ABNORMAL HIGH (ref 6.0–8.3)

## 2013-03-26 LAB — COMPREHENSIVE METABOLIC PANEL
ALT: 14 U/L (ref 0–53)
AST: 13 U/L (ref 0–37)
Albumin: 3.6 g/dL (ref 3.5–5.2)
Alkaline Phosphatase: 86 U/L (ref 39–117)
GFR calc Af Amer: >90 mL/min (ref >90)
Glucose, Bld: 232 mg/dL — ABNORMAL HIGH (ref 70–99)
Potassium: 3.7 mEq/L (ref 3.5–5.1)
Sodium: 127 mEq/L — ABNORMAL LOW (ref 135–145)
Total Protein: 6.8 g/dL (ref 6.0–8.3)

## 2013-03-26 LAB — GLUCOSE, CAPILLARY
Glucose-Capillary: 278 mg/dL — ABNORMAL HIGH (ref 70–99)
Glucose-Capillary: 274 mg/dL — ABNORMAL HIGH (ref 70–99)

## 2013-03-26 LAB — CBC
HCT: 41.8 % (ref 39.0–52.0)
MCH: 30.9 pg (ref 26.0–34.0)
MCHC: 35.9 g/dL (ref 30.0–36.0)
MCV: 86 fL (ref 78.0–100.0)
RDW: 11.3 % — ABNORMAL LOW (ref 11.5–15.5)

## 2013-03-26 MED ORDER — SODIUM CHLORIDE 0.9 % IV SOLN
INTRAVENOUS | Status: DC
  Administered 2013-03-26 (×2): via INTRAVENOUS

## 2013-03-26 MED ORDER — PANTOPRAZOLE SODIUM 40 MG IV SOLR
40.0000 mg | Freq: Two times a day (BID) | INTRAVENOUS | Status: DC
  Administered 2013-03-26 – 2013-03-27 (×2): 40 mg via INTRAVENOUS
  Filled 2013-03-26 (×3): qty 40

## 2013-03-26 MED ORDER — INSULIN ASPART 100 UNIT/ML ~~LOC~~ SOLN
0.0000 [IU] | Freq: Three times a day (TID) | SUBCUTANEOUS | Status: DC
  Administered 2013-03-26: 3 [IU] via SUBCUTANEOUS
  Administered 2013-03-26 – 2013-03-27 (×2): 8 [IU] via SUBCUTANEOUS
  Administered 2013-03-27: 2 [IU] via SUBCUTANEOUS

## 2013-03-26 MED ORDER — ONDANSETRON HCL 4 MG PO TABS: 4.0000 mg | ORAL_TABLET | Freq: Four times a day (QID) | ORAL | Status: DC | PRN

## 2013-03-26 MED ORDER — INSULIN ASPART 100 UNIT/ML ~~LOC~~ SOLN
0.0000 [IU] | SUBCUTANEOUS | Status: DC
  Administered 2013-03-26: 5 [IU] via SUBCUTANEOUS
  Administered 2013-03-26: 8 [IU] via SUBCUTANEOUS

## 2013-03-26 MED ORDER — INSULIN DETEMIR 100 UNIT/ML ~~LOC~~ SOLN
12.0000 [IU] | Freq: Every day | SUBCUTANEOUS | Status: DC
  Administered 2013-03-26: 12 [IU] via SUBCUTANEOUS
  Filled 2013-03-26 (×2): qty 0.12

## 2013-03-26 MED ORDER — ACETAMINOPHEN 650 MG RE SUPP: 650.0000 mg | Freq: Four times a day (QID) | RECTAL | Status: DC | PRN

## 2013-03-26 MED ORDER — ACETAMINOPHEN 325 MG PO TABS
650.0000 mg | ORAL_TABLET | Freq: Four times a day (QID) | ORAL | Status: DC | PRN
  Administered 2013-03-27: 650 mg via ORAL
  Filled 2013-03-26: qty 2

## 2013-03-26 MED ORDER — IOHEXOL 300 MG/ML  SOLN
100.0000 mL | Freq: Once | INTRAMUSCULAR | Status: AC | PRN
  Administered 2013-03-26: 100 mL via INTRAVENOUS

## 2013-03-26 MED ORDER — INSULIN ASPART 100 UNIT/ML ~~LOC~~ SOLN
0.0000 [IU] | Freq: Every day | SUBCUTANEOUS | Status: DC
  Administered 2013-03-26: 3 [IU] via SUBCUTANEOUS

## 2013-03-26 MED ORDER — ONDANSETRON HCL 4 MG/2ML IJ SOLN: 4.0000 mg | Freq: Four times a day (QID) | INTRAMUSCULAR | Status: DC | PRN

## 2013-03-26 MED ORDER — IOHEXOL 300 MG/ML  SOLN
50.0000 mL | Freq: Once | INTRAMUSCULAR | Status: AC | PRN
  Administered 2013-03-26: 50 mL via ORAL

## 2013-03-26 MED ORDER — HYDROMORPHONE HCL PF 1 MG/ML IJ SOLN
1.0000 mg | INTRAMUSCULAR | Status: DC | PRN
  Administered 2013-03-26 – 2013-03-27 (×3): 1 mg via INTRAVENOUS
  Filled 2013-03-26 (×4): qty 1

## 2013-03-26 MED ORDER — BOOST / RESOURCE BREEZE PO LIQD
1.0000 | Freq: Three times a day (TID) | ORAL | Status: DC
  Administered 2013-03-26: 1 via ORAL

## 2013-03-26 MED ORDER — PANTOPRAZOLE SODIUM 40 MG IV SOLR
40.0000 mg | Freq: Once | INTRAVENOUS | Status: AC
  Administered 2013-03-26: 40 mg via INTRAVENOUS
  Filled 2013-03-26: qty 40

## 2013-03-26 MED ORDER — POTASSIUM CHLORIDE IN NACL 40-0.9 MEQ/L-% IV SOLN
INTRAVENOUS | Status: DC
  Administered 2013-03-26 – 2013-03-27 (×3): via INTRAVENOUS
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
## 2013-03-26 NOTE — Progress Notes (Signed)
The patient is a 22 year old incarcerated man with a history of diabetes mellitus, who was admitted early this morning for abdominal pain, nausea, and vomiting. He was briefly seen and examined. His chart, vital signs, and laboratory studies were reviewed. He denies vomiting but still has a little nausea. He would like to try liquids. CT of his abdomen and pelvis results were unrevealing. Ultrasound of the abdomen was ordered as well and it revealed no acute pathology. Because of his dehydration, uncontrolled diabetes, and hyponatremia, we'll continue to treat him with IV fluids and insulin adjusted accordingly. He was diagnosed with diabetes in his early teens so it is likely he has type 1 diabetes and will need insulin indefinitely.  We'll start full liquid diet and give insulin each bedtime eating each bedtime. We'll add bedtime Levemir.

## 2013-03-26 NOTE — Progress Notes (Signed)
Nutrition Brief Note  Patient identified on the Malnutrition Screening Tool (MST) Report  Body mass index is 22.07 kg/(m^2). Patient meets criteria for normal range based on current BMI.   Current diet order is full liquids, patient is consuming approximately 0% of meals at this time. Labs and medications reviewed. Add Nurse, adult for nutrition support and monitor tolerance and diet advancement.   Royann Shivers MS,RD,LDN Office: [phone] Pager: [phone]

## 2013-03-27 ENCOUNTER — Encounter (HOSPITAL_COMMUNITY): Payer: Self-pay | Admitting: Internal Medicine

## 2013-03-27 DIAGNOSIS — E109 Type 1 diabetes mellitus without complications: Secondary | ICD-10-CM

## 2013-03-27 DIAGNOSIS — K529 Noninfective gastroenteritis and colitis, unspecified: Secondary | ICD-10-CM | POA: Diagnosis present

## 2013-03-27 DIAGNOSIS — K5289 Other specified noninfective gastroenteritis and colitis: Secondary | ICD-10-CM

## 2013-03-27 DIAGNOSIS — N289 Disorder of kidney and ureter, unspecified: Secondary | ICD-10-CM

## 2013-03-27 DIAGNOSIS — E860 Dehydration: Secondary | ICD-10-CM

## 2013-03-27 DIAGNOSIS — Z720 Tobacco use: Secondary | ICD-10-CM | POA: Diagnosis present

## 2013-03-27 LAB — CBC
MCH: 31.4 pg (ref 26.0–34.0)
MCV: 87.7 fL (ref 78.0–100.0)
Platelets: 197 10*3/uL (ref 150–400)
RDW: 11.2 % — ABNORMAL LOW (ref 11.5–15.5)

## 2013-03-27 LAB — BASIC METABOLIC PANEL
CO2: 31 mEq/L (ref 19–32)
Calcium: 8.7 mg/dL (ref 8.4–10.5)
Creatinine, Ser: 0.75 mg/dL (ref 0.50–1.35)
GFR calc non Af Amer: >90 mL/min (ref >90)
Glucose, Bld: 342 mg/dL — ABNORMAL HIGH (ref 70–99)

## 2013-03-27 LAB — URINE CULTURE

## 2013-03-27 LAB — GLUCOSE, CAPILLARY: Glucose-Capillary: 296 mg/dL — ABNORMAL HIGH (ref 70–99)

## 2013-03-27 MED ORDER — INSULIN NPH ISOPHANE & REGULAR (70-30) 100 UNIT/ML ~~LOC~~ SUSP: 15.0000 [IU] | Freq: Two times a day (BID) | SUBCUTANEOUS | Status: DC

## 2013-03-27 MED ORDER — INSULIN NPH ISOPHANE & REGULAR (70-30) 100 UNIT/ML ~~LOC~~ SUSP: 12.0000 [IU] | Freq: Two times a day (BID) | SUBCUTANEOUS | Status: DC

## 2013-03-27 MED ORDER — OMEPRAZOLE 40 MG PO CPDR: 40.0000 mg | DELAYED_RELEASE_CAPSULE | Freq: Every day | ORAL | Status: DC
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
## 2013-03-27 NOTE — Progress Notes (Signed)
IV removed, site WNL.  Pt given d/c instructions and new prescriptions.  Discussed need to check sugar twice daily and take medication as ordered.  Pt has taken insulin in the past. Patient verbalizes understanding of discharge education, teachback completed. PCP on-call for new patients is Dr Sherwood Gambler, F/U appointment to be made by the patient as the office is closed, pt states they will make and keep appointment. Pt is stable at this time. Guard aware that AVS needs to be given to nurse at facility and given to patient at time of release. Pt taken to main entrance in wheelchair by staff member, escorted by guard.

## 2013-03-27 NOTE — Progress Notes (Signed)
Spoke with RN at [facility], patient will be able to receive medications as ordered at discharge once there.

## 2016-01-10 ENCOUNTER — Ambulatory Visit: Payer: Self-pay | Admitting: Physician Assistant

## 2016-01-14 ENCOUNTER — Encounter: Payer: Self-pay | Admitting: Physician Assistant

## 2016-01-14 ENCOUNTER — Ambulatory Visit: Payer: Self-pay | Admitting: Physician Assistant

## 2016-01-14 VITALS — BP 144/96 | HR 95 | Temp 97.7°F | Ht 73.0 in | Wt 198.5 lb

## 2016-01-14 DIAGNOSIS — E119 Type 2 diabetes mellitus without complications: Secondary | ICD-10-CM | POA: Insufficient documentation

## 2016-01-14 DIAGNOSIS — E1065 Type 1 diabetes mellitus with hyperglycemia: Principal | ICD-10-CM

## 2016-01-14 DIAGNOSIS — F17210 Nicotine dependence, cigarettes, uncomplicated: Secondary | ICD-10-CM

## 2016-01-14 DIAGNOSIS — I10 Essential (primary) hypertension: Secondary | ICD-10-CM

## 2016-01-14 DIAGNOSIS — Z13220 Encounter for screening for lipoid disorders: Secondary | ICD-10-CM

## 2016-01-14 DIAGNOSIS — IMO0001 Reserved for inherently not codable concepts without codable children: Secondary | ICD-10-CM

## 2016-01-14 LAB — GLUCOSE, POCT (MANUAL RESULT ENTRY): POC Glucose: 352 mg/dl — AB (ref 70–99)

## 2016-01-14 MED ORDER — LISINOPRIL 10 MG PO TABS: 10.0000 mg | ORAL_TABLET | Freq: Every day | ORAL | Status: DC

## 2016-01-14 NOTE — Progress Notes (Signed)
BP 144/96 mmHg  Pulse 95  Temp(Src) 97.7 F (36.5 C)  Ht 6\' 1"  (1.854 m)  Wt 198 lb 8 oz (90.039 kg)  BMI 26.19 kg/m2  SpO2 97%   Subjective:    Patient ID: Andrew Morales, male    DOB: 12/24/1990, 25 y.o.   MRN: [id number]  HPI: Andrew Deistrthur D Samons is a 25 y.o. male presenting on 01/14/2016 for New Patient (Initial Visit)   HPI   Chief Complaint  Patient presents with  . New Patient (Initial Visit)    pt has not had a PCP. pt had been in prison for 6 months and they were the ones who supplied him with insulin.    Pt dx with dm 2009.  He says he hadn't seem dr in long time prior to going to prison.    He says he doesn't know what his a1c in prison was.   Relevant past medical, surgical, family and social history reviewed and updated as indicated. Interim medical history since our last visit reviewed. Allergies and medications reviewed and updated.   Current outpatient prescriptions:  .  insulin NPH-regular (NOVOLIN 70/30) (70-30) 100 UNIT/ML injection, Inject 12 Units into the skin 2 (two) times daily with a meal. (Patient taking differently: Inject into the skin 2 (two) times daily with a meal. 15 units qAM and 8 units at dinner), Disp: 10 mL, Rfl: 12   Review of Systems  Constitutional: Negative for fever, chills, diaphoresis, appetite change, fatigue and unexpected weight change.  HENT: Positive for dental problem. Negative for congestion, drooling, ear pain, facial swelling, hearing loss, mouth sores, sneezing, sore throat, trouble swallowing and voice change.   Eyes: Negative for pain, discharge, redness, itching and visual disturbance.  Respiratory: Negative for cough, choking, shortness of breath and wheezing.   Cardiovascular: Negative for chest pain, palpitations and leg swelling.  Gastrointestinal: Negative for vomiting, abdominal pain, diarrhea, constipation and blood in stool.  Endocrine: Negative for cold intolerance, heat intolerance and polydipsia.   Genitourinary: Negative for dysuria, hematuria and decreased urine volume.  Musculoskeletal: Negative for back pain, arthralgias and gait problem.  Skin: Negative for rash.  Allergic/Immunologic: Negative for environmental allergies.  Neurological: Negative for seizures, syncope, light-headedness and headaches.  Hematological: Negative for adenopathy.  Psychiatric/Behavioral: Negative for suicidal ideas, dysphoric mood and agitation. The patient is not nervous/anxious.     Per HPI unless specifically indicated above     Objective:    BP 144/96 mmHg  Pulse 95  Temp(Src) 97.7 F (36.5 C)  Ht 6\' 1"  (1.854 m)  Wt 198 lb 8 oz (90.039 kg)  BMI 26.19 kg/m2  SpO2 97%  Wt Readings from Last 3 Encounters:  01/14/16 198 lb 8 oz (90.039 kg)  03/25/13 172 lb (78.019 kg)  11/21/11 202 lb 12.8 oz (91.989 kg)    Physical Exam  Constitutional: He is oriented to person, place, and time. He appears well-developed and well-nourished.  HENT:  Head: Normocephalic and atraumatic.  Mouth/Throat: Oropharynx is clear and moist. No oropharyngeal exudate.  Eyes: Conjunctivae and EOM are normal. Pupils are equal, round, and reactive to light.  Neck: Neck supple. No thyromegaly present.  Cardiovascular: Normal rate and regular rhythm.   Pulmonary/Chest: Effort normal and breath sounds normal. He has no wheezes. He has no rales.  Abdominal: Soft. Bowel sounds are normal. He exhibits no mass. There is no hepatosplenomegaly. There is no tenderness.  Musculoskeletal: He exhibits no edema.  Lymphadenopathy:    He has no  cervical adenopathy.  Neurological: He is alert and oriented to person, place, and time.  Skin: Skin is warm and dry. No rash noted.  Psychiatric: He has a normal mood and affect. His behavior is normal. Thought content normal.  Vitals reviewed.       Assessment & Plan:   Encounter Diagnoses  Name Primary?  Marland Kitchen Uncontrolled type 1 diabetes mellitus without complication (HCC) Yes  .  Essential hypertension, benign   . Cigarette nicotine dependence without complication   . Screening cholesterol level     -pt to go to lab and check labs when leaves office today. -rx lisinopril -Gave sample novolog 70/30 -pt to go to DM education class -counseled on smoking cessation -F/u 3 wk with bs log

## 2016-01-14 NOTE — Patient Instructions (Addendum)
Get blood drawn Go to diabetes education class Start lisinopril one tablet daily Bring blood sugar log to follow-up appointment

## 2016-01-15 LAB — CBC WITH DIFFERENTIAL/PLATELET
BASOS ABS: 0 {cells}/uL (ref 0–200)
Basophils Relative: 0 %
EOS PCT: 2 %
Eosinophils Absolute: 104 cells/uL (ref 15–500)
HCT: 40.7 % (ref 38.5–50.0)
HEMOGLOBIN: 13.6 g/dL (ref 13.2–17.1)
LYMPHS ABS: 1196 {cells}/uL (ref 850–3900)
LYMPHS PCT: 23 %
MCH: 30 pg (ref 27.0–33.0)
MCHC: 33.4 g/dL (ref 32.0–36.0)
MCV: 89.6 fL (ref 80.0–100.0)
MONOS PCT: 7 %
MPV: 10.9 fL (ref 7.5–12.5)
Monocytes Absolute: 364 cells/uL (ref 200–950)
NEUTROS PCT: 68 %
Neutro Abs: 3536 cells/uL (ref 1500–7800)
Platelets: 210 10*3/uL (ref 140–400)
RBC: 4.54 MIL/uL (ref 4.20–5.80)
RDW: 13 % (ref 11.0–15.0)
WBC: 5.2 10*3/uL (ref 3.8–10.8)

## 2016-01-15 LAB — HEMOGLOBIN A1C
HEMOGLOBIN A1C: 12.3 % — AB (ref <5.7)
MEAN PLASMA GLUCOSE: 306 mg/dL

## 2016-01-16 LAB — LIPID PANEL
CHOL/HDL RATIO: 4.4 ratio (ref <=5.0)
Cholesterol: 206 mg/dL — ABNORMAL HIGH (ref 125–200)
HDL: 47 mg/dL (ref >=40)
LDL Cholesterol: 128 mg/dL (ref <130)
Triglycerides: 155 mg/dL — ABNORMAL HIGH (ref <150)
VLDL: 31 mg/dL — ABNORMAL HIGH (ref <30)

## 2016-01-16 LAB — C-PEPTIDE: C PEPTIDE: 0.62 ng/mL — AB (ref 0.80–3.85)

## 2016-01-16 LAB — COMPLETE METABOLIC PANEL WITH GFR
ALBUMIN: 3.9 g/dL (ref 3.6–5.1)
ALK PHOS: 108 U/L (ref 40–115)
ALT: 18 U/L (ref 9–46)
AST: 16 U/L (ref 10–40)
BILIRUBIN TOTAL: 0.5 mg/dL (ref 0.2–1.2)
BUN: 10 mg/dL (ref 7–25)
CALCIUM: 8.9 mg/dL (ref 8.6–10.3)
CO2: 27 mmol/L (ref 20–31)
CREATININE: 0.86 mg/dL (ref 0.60–1.35)
Chloride: 99 mmol/L (ref 98–110)
Glucose, Bld: 334 mg/dL — ABNORMAL HIGH (ref 65–99)
Potassium: 4.4 mmol/L (ref 3.5–5.3)
Sodium: 136 mmol/L (ref 135–146)
TOTAL PROTEIN: 6.8 g/dL (ref 6.1–8.1)

## 2016-01-16 LAB — MICROALBUMIN, URINE: MICROALB UR: 55.2 mg/dL

## 2016-02-05 ENCOUNTER — Ambulatory Visit: Payer: Self-pay | Admitting: Physician Assistant

## 2016-02-06 ENCOUNTER — Encounter: Payer: Self-pay | Admitting: Physician Assistant

## 2016-04-23 ENCOUNTER — Ambulatory Visit: Payer: Self-pay | Admitting: Physician Assistant

## 2016-05-01 ENCOUNTER — Ambulatory Visit: Payer: Self-pay | Admitting: Physician Assistant

## 2016-05-05 ENCOUNTER — Encounter: Payer: Self-pay | Admitting: Physician Assistant

## 2016-12-27 ENCOUNTER — Emergency Department (HOSPITAL_COMMUNITY)
Admission: EM | Admit: 2016-12-27 | Discharge: 2016-12-27 | Disposition: A | Discharge status: Home / Self Care | Payer: PRIVATE HEALTH INSURANCE | Attending: Emergency Medicine | Admitting: Emergency Medicine

## 2016-12-27 ENCOUNTER — Encounter (HOSPITAL_COMMUNITY): Payer: Self-pay

## 2016-12-27 DIAGNOSIS — Z79899 Other long term (current) drug therapy: Secondary | ICD-10-CM | POA: Insufficient documentation

## 2016-12-27 DIAGNOSIS — K047 Periapical abscess without sinus: Secondary | ICD-10-CM

## 2016-12-27 DIAGNOSIS — F17210 Nicotine dependence, cigarettes, uncomplicated: Secondary | ICD-10-CM | POA: Insufficient documentation

## 2016-12-27 DIAGNOSIS — I10 Essential (primary) hypertension: Secondary | ICD-10-CM | POA: Insufficient documentation

## 2016-12-27 DIAGNOSIS — E109 Type 1 diabetes mellitus without complications: Secondary | ICD-10-CM | POA: Insufficient documentation

## 2016-12-27 DIAGNOSIS — K0889 Other specified disorders of teeth and supporting structures: Secondary | ICD-10-CM

## 2016-12-27 LAB — CBC WITH DIFFERENTIAL/PLATELET
BASOS PCT: 0 %
Basophils Absolute: 0 10*3/uL (ref 0.0–0.1)
EOS ABS: 0.2 10*3/uL (ref 0.0–0.7)
Eosinophils Relative: 2 %
HCT: 34.1 % — ABNORMAL LOW (ref 39.0–52.0)
HEMOGLOBIN: 11.8 g/dL — AB (ref 13.0–17.0)
Lymphocytes Relative: 14 %
Lymphs Abs: 1.2 10*3/uL (ref 0.7–4.0)
MCH: 30.8 pg (ref 26.0–34.0)
MCHC: 34.6 g/dL (ref 30.0–36.0)
MCV: 89 fL (ref 78.0–100.0)
MONOS PCT: 4 %
Monocytes Absolute: 0.4 10*3/uL (ref 0.1–1.0)
NEUTROS ABS: 6.6 10*3/uL (ref 1.7–7.7)
NEUTROS PCT: 80 %
Platelets: 216 10*3/uL (ref 150–400)
RBC: 3.83 MIL/uL — AB (ref 4.22–5.81)
RDW: 12.4 % (ref 11.5–15.5)
WBC: 8.3 10*3/uL (ref 4.0–10.5)

## 2016-12-27 LAB — URINALYSIS, ROUTINE W REFLEX MICROSCOPIC
Bacteria, UA: NONE SEEN
Bilirubin Urine: NEGATIVE
Ketones, ur: NEGATIVE mg/dL
Leukocytes, UA: NEGATIVE
NITRITE: NEGATIVE
Protein, ur: >=300 mg/dL — AB
SPECIFIC GRAVITY, URINE: 1.026 (ref 1.005–1.030)
Squamous Epithelial / HPF: NONE SEEN
pH: 6 (ref 5.0–8.0)

## 2016-12-27 LAB — I-STAT VENOUS BLOOD GAS, ED
ACID-BASE EXCESS: 5 mmol/L — AB (ref 0.0–2.0)
BICARBONATE: 30.6 mmol/L — AB (ref 20.0–28.0)
O2 Saturation: 96 %
PH VEN: 7.408 (ref 7.250–7.430)
PO2 VEN: 80 mmHg — AB (ref 32.0–45.0)
TCO2: 32 mmol/L (ref 0–100)
pCO2, Ven: 48.5 mmHg (ref 44.0–60.0)

## 2016-12-27 LAB — BASIC METABOLIC PANEL
ANION GAP: 8 (ref 5–15)
BUN: 5 mg/dL — ABNORMAL LOW (ref 6–20)
CHLORIDE: 100 mmol/L — AB (ref 101–111)
CO2: 28 mmol/L (ref 22–32)
CREATININE: 0.97 mg/dL (ref 0.61–1.24)
Calcium: 8.7 mg/dL — ABNORMAL LOW (ref 8.9–10.3)
GFR calc non Af Amer: >60 mL/min (ref >60)
Glucose, Bld: 346 mg/dL — ABNORMAL HIGH (ref 65–99)
POTASSIUM: 4 mmol/L (ref 3.5–5.1)
SODIUM: 136 mmol/L (ref 135–145)

## 2016-12-27 LAB — CBG MONITORING, ED
Glucose-Capillary: 346 mg/dL — ABNORMAL HIGH (ref 65–99)
Glucose-Capillary: 165 mg/dL — ABNORMAL HIGH (ref 65–99)

## 2016-12-27 MED ORDER — INSULIN ASPART 100 UNIT/ML ~~LOC~~ SOLN
SUBCUTANEOUS | Status: AC
  Filled 2016-12-27: qty 1

## 2016-12-27 MED ORDER — AMLODIPINE BESYLATE 5 MG PO TABS
10.0000 mg | ORAL_TABLET | Freq: Once | ORAL | Status: AC
  Administered 2016-12-27: 10 mg via ORAL

## 2016-12-27 MED ORDER — PENICILLIN V POTASSIUM 500 MG PO TABS: 500.0000 mg | ORAL_TABLET | Freq: Four times a day (QID) | ORAL | 0 refills | Status: DC

## 2016-12-27 MED ORDER — AMLODIPINE BESYLATE 10 MG PO TABS: 10.0000 mg | ORAL_TABLET | Freq: Every day | ORAL | 0 refills | Status: DC

## 2016-12-27 MED ORDER — AMLODIPINE BESYLATE 5 MG PO TABS
10.0000 mg | ORAL_TABLET | Freq: Once | ORAL | Status: DC
  Filled 2016-12-27: qty 2

## 2016-12-27 MED ORDER — KETOROLAC TROMETHAMINE 30 MG/ML IJ SOLN
30.0000 mg | Freq: Once | INTRAMUSCULAR | Status: AC
  Administered 2016-12-27: 30 mg via INTRAVENOUS
  Filled 2016-12-27: qty 1

## 2016-12-27 MED ORDER — HYDROCODONE-ACETAMINOPHEN 5-325 MG PO TABS
1.0000 | ORAL_TABLET | Freq: Once | ORAL | Status: AC
  Administered 2016-12-27: 1 via ORAL
  Filled 2016-12-27: qty 1

## 2016-12-27 MED ORDER — NAPROXEN 500 MG PO TABS: 500.0000 mg | ORAL_TABLET | Freq: Two times a day (BID) | ORAL | 0 refills | Status: DC

## 2016-12-27 MED ORDER — INSULIN ASPART 100 UNIT/ML ~~LOC~~ SOLN
6.0000 [IU] | Freq: Once | SUBCUTANEOUS | Status: DC
  Filled 2016-12-27: qty 1

## 2016-12-27 MED ORDER — SODIUM CHLORIDE 0.9 % IV BOLUS (SEPSIS)
500.0000 mL | Freq: Once | INTRAVENOUS | Status: AC
  Administered 2016-12-27: 500 mL via INTRAVENOUS
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
## 2016-12-27 NOTE — ED Triage Notes (Signed)
Pt reports two days of left side headache, woke up this am with left side dental/facial pain with swelling.

## 2016-12-27 NOTE — ED Notes (Signed)
cbg was 346 

## 2018-01-20 ENCOUNTER — Encounter (HOSPITAL_COMMUNITY): Payer: Self-pay | Admitting: Emergency Medicine

## 2018-01-20 ENCOUNTER — Emergency Department (HOSPITAL_COMMUNITY)
Admission: EM | Admit: 2018-01-20 | Discharge: 2018-01-20 | Disposition: A | Discharge status: Home / Self Care | Payer: PRIVATE HEALTH INSURANCE | Attending: Emergency Medicine | Admitting: Emergency Medicine

## 2018-01-20 ENCOUNTER — Other Ambulatory Visit: Payer: Self-pay

## 2018-01-20 DIAGNOSIS — X58XXXA Exposure to other specified factors, initial encounter: Secondary | ICD-10-CM | POA: Insufficient documentation

## 2018-01-20 DIAGNOSIS — S032XXA Dislocation of tooth, initial encounter: Secondary | ICD-10-CM | POA: Insufficient documentation

## 2018-01-20 DIAGNOSIS — Y999 Unspecified external cause status: Secondary | ICD-10-CM | POA: Insufficient documentation

## 2018-01-20 DIAGNOSIS — Z79899 Other long term (current) drug therapy: Secondary | ICD-10-CM | POA: Insufficient documentation

## 2018-01-20 DIAGNOSIS — Y929 Unspecified place or not applicable: Secondary | ICD-10-CM | POA: Insufficient documentation

## 2018-01-20 DIAGNOSIS — I10 Essential (primary) hypertension: Secondary | ICD-10-CM | POA: Insufficient documentation

## 2018-01-20 DIAGNOSIS — Y939 Activity, unspecified: Secondary | ICD-10-CM | POA: Insufficient documentation

## 2018-01-20 DIAGNOSIS — E1065 Type 1 diabetes mellitus with hyperglycemia: Secondary | ICD-10-CM | POA: Insufficient documentation

## 2018-01-20 DIAGNOSIS — F17210 Nicotine dependence, cigarettes, uncomplicated: Secondary | ICD-10-CM | POA: Insufficient documentation

## 2018-01-20 LAB — I-STAT CHEM 8, ED
BUN: 17 mg/dL (ref 6–20)
CALCIUM ION: 1.16 mmol/L (ref 1.15–1.40)
CHLORIDE: 98 mmol/L — AB (ref 101–111)
CREATININE: 1 mg/dL (ref 0.61–1.24)
Glucose, Bld: 387 mg/dL — ABNORMAL HIGH (ref 65–99)
HCT: 30 % — ABNORMAL LOW (ref 39.0–52.0)
Hemoglobin: 10.2 g/dL — ABNORMAL LOW (ref 13.0–17.0)
Potassium: 4.6 mmol/L (ref 3.5–5.1)
Sodium: 137 mmol/L (ref 135–145)
TCO2: 30 mmol/L (ref 22–32)

## 2018-01-20 LAB — CBG MONITORING, ED: Glucose-Capillary: 415 mg/dL — ABNORMAL HIGH (ref 65–99)

## 2018-01-20 MED ORDER — IBUPROFEN 600 MG PO TABS: 600.0000 mg | ORAL_TABLET | Freq: Four times a day (QID) | ORAL | 0 refills | Status: DC | PRN

## 2018-01-20 MED ORDER — INSULIN NPH ISOPHANE & REGULAR (70-30) 100 UNIT/ML ~~LOC~~ SUSP: SUBCUTANEOUS | 1 refills | Status: DC

## 2018-01-20 MED ORDER — LISINOPRIL 10 MG PO TABS: 10.0000 mg | ORAL_TABLET | Freq: Every day | ORAL | 1 refills | Status: DC

## 2018-01-20 MED ORDER — ACETAMINOPHEN ER 650 MG PO TBCR: 650.0000 mg | EXTENDED_RELEASE_TABLET | Freq: Three times a day (TID) | ORAL | 0 refills | Status: DC

## 2018-01-20 MED ORDER — PENICILLIN V POTASSIUM 500 MG PO TABS: 500.0000 mg | ORAL_TABLET | Freq: Four times a day (QID) | ORAL | 0 refills | Status: DC

## 2018-01-20 MED ORDER — HYDROCODONE-ACETAMINOPHEN 5-325 MG PO TABS
1.0000 | ORAL_TABLET | Freq: Once | ORAL | Status: AC
  Administered 2018-01-20: 1 via ORAL
  Filled 2018-01-20: qty 1

## 2018-01-20 MED ORDER — NAPROXEN 250 MG PO TABS
500.0000 mg | ORAL_TABLET | Freq: Once | ORAL | Status: AC
  Administered 2018-01-20: 500 mg via ORAL
  Filled 2018-01-20: qty 2
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
## 2018-01-20 NOTE — Discharge Instructions (Signed)
He was seen in the ER for your toothache.  Please call Dr. Lucky Cowboy THIS MORNING, for an appointment. Take the antibiotics prescribed.  In addition, we are prescribing you your insulin and home dose of blood pressure medicine. Diabetes and hypertension aside and killers, therefore it is prudent that you take your medications as prescribed to control those diseases.  Our social worker will call you in the morning to see if there is any help available for your insulin. Rest of the medications prescribed are all less than $10 at [facility].

## 2018-01-20 NOTE — Patient Outreach (Signed)
Received an email from nightshift ED RN regarding patient not being able to afford insulin. Asked to call patient at [phone] for follow up. Called number noted and patient's girlfriend answered and she stated that Andrew Morales was at home and she was at work until [employer]. Asked if she could have him call me back or could I call him on another number. She stated that he does not have any other numbers and that she will let patient know about anything she needs to this evening. She stated that patient does not have insurance and is not able to get his insulin. Discussed clinics in [facility] (Health Department, [facility], [facility]) and asked that he make an appointment with one of the clinics for follow up and medication needs. Discussed NOVOLIN insulin (70/30, NPH, Regular) at [facility] for $25 per vial and explained that he could purchase those particular insulins without a prescription at [facility]. She stated that she would be sure to let Lottie know and that she will have him call me back at number called from if he has any questions.  Thanks, Orlando Penner, RN, MSN, CDE Diabetes Coordinator Inpatient Diabetes Program [phone] (Team Pager from 8am to 5pm)

## 2018-01-20 NOTE — ED Triage Notes (Signed)
Per [facility] pt CBG 443, hasn't taken insulin in cpl weeks, hypertensive 196/130, pt also c/o of toothache starting tonight when he noticed "there was a hole in it"

## 2018-06-02 ENCOUNTER — Emergency Department (HOSPITAL_COMMUNITY)
Admission: EM | Admit: 2018-06-02 | Discharge: 2018-06-03 | Disposition: A | Discharge status: Home / Self Care | Payer: Self-pay | Attending: Emergency Medicine | Admitting: Emergency Medicine

## 2018-06-02 ENCOUNTER — Encounter (HOSPITAL_COMMUNITY): Payer: Self-pay

## 2018-06-02 ENCOUNTER — Other Ambulatory Visit: Payer: Self-pay

## 2018-06-02 DIAGNOSIS — M5489 Other dorsalgia: Secondary | ICD-10-CM | POA: Insufficient documentation

## 2018-06-02 DIAGNOSIS — Z5321 Procedure and treatment not carried out due to patient leaving prior to being seen by health care provider: Secondary | ICD-10-CM | POA: Insufficient documentation

## 2018-06-02 LAB — URINALYSIS, ROUTINE W REFLEX MICROSCOPIC
Bilirubin Urine: NEGATIVE
Glucose, UA: NEGATIVE mg/dL
KETONES UR: NEGATIVE mg/dL
LEUKOCYTES UA: NEGATIVE
NITRITE: NEGATIVE
PH: 6 (ref 5.0–8.0)
Specific Gravity, Urine: 1.019 (ref 1.005–1.030)

## 2018-06-02 LAB — CBG MONITORING, ED: Glucose-Capillary: 166 mg/dL — ABNORMAL HIGH (ref 70–99)

## 2018-06-02 NOTE — ED Provider Notes (Cosign Needed)
Pt eloped prior to being seen.  I did not establish care with patient.   [name], [place] 06/02/18 1307

## 2018-06-02 NOTE — ED Triage Notes (Signed)
Pt c/o pain in lower back since last night.  Denies injury, denies urinary symptoms.

## 2018-06-02 NOTE — ED Notes (Signed)
Patient left AMA.

## 2019-01-13 DIAGNOSIS — E119 Type 2 diabetes mellitus without complications: Secondary | ICD-10-CM | POA: Diagnosis not present

## 2019-01-13 DIAGNOSIS — R319 Hematuria, unspecified: Secondary | ICD-10-CM | POA: Diagnosis not present

## 2019-01-13 DIAGNOSIS — F17200 Nicotine dependence, unspecified, uncomplicated: Secondary | ICD-10-CM | POA: Diagnosis not present

## 2019-01-13 DIAGNOSIS — Z794 Long term (current) use of insulin: Secondary | ICD-10-CM | POA: Diagnosis not present

## 2019-01-13 DIAGNOSIS — E109 Type 1 diabetes mellitus without complications: Secondary | ICD-10-CM | POA: Diagnosis not present

## 2019-01-13 DIAGNOSIS — Z91018 Allergy to other foods: Secondary | ICD-10-CM | POA: Diagnosis not present

## 2019-01-13 DIAGNOSIS — R1084 Generalized abdominal pain: Secondary | ICD-10-CM | POA: Diagnosis not present

## 2019-01-13 DIAGNOSIS — R52 Pain, unspecified: Secondary | ICD-10-CM | POA: Diagnosis not present

## 2019-01-13 DIAGNOSIS — I10 Essential (primary) hypertension: Secondary | ICD-10-CM | POA: Diagnosis not present

## 2019-01-13 DIAGNOSIS — E860 Dehydration: Secondary | ICD-10-CM | POA: Diagnosis not present

## 2019-01-13 DIAGNOSIS — R197 Diarrhea, unspecified: Secondary | ICD-10-CM | POA: Diagnosis not present

## 2019-01-13 DIAGNOSIS — Z79899 Other long term (current) drug therapy: Secondary | ICD-10-CM | POA: Diagnosis not present

## 2019-01-13 DIAGNOSIS — R112 Nausea with vomiting, unspecified: Secondary | ICD-10-CM | POA: Diagnosis not present

## 2019-01-13 DIAGNOSIS — N200 Calculus of kidney: Secondary | ICD-10-CM | POA: Diagnosis not present

## 2019-01-14 DIAGNOSIS — R319 Hematuria, unspecified: Secondary | ICD-10-CM | POA: Diagnosis not present

## 2019-01-16 DIAGNOSIS — R110 Nausea: Secondary | ICD-10-CM | POA: Diagnosis not present

## 2019-01-16 DIAGNOSIS — I10 Essential (primary) hypertension: Secondary | ICD-10-CM | POA: Diagnosis not present

## 2019-01-16 DIAGNOSIS — Z794 Long term (current) use of insulin: Secondary | ICD-10-CM | POA: Diagnosis not present

## 2019-01-16 DIAGNOSIS — F17200 Nicotine dependence, unspecified, uncomplicated: Secondary | ICD-10-CM | POA: Diagnosis not present

## 2019-01-16 DIAGNOSIS — R197 Diarrhea, unspecified: Secondary | ICD-10-CM | POA: Diagnosis not present

## 2019-01-16 DIAGNOSIS — R112 Nausea with vomiting, unspecified: Secondary | ICD-10-CM | POA: Diagnosis not present

## 2019-01-16 DIAGNOSIS — R109 Unspecified abdominal pain: Secondary | ICD-10-CM | POA: Diagnosis not present

## 2019-01-16 DIAGNOSIS — R1111 Vomiting without nausea: Secondary | ICD-10-CM | POA: Diagnosis not present

## 2019-01-16 DIAGNOSIS — Z79899 Other long term (current) drug therapy: Secondary | ICD-10-CM | POA: Diagnosis not present

## 2019-01-16 DIAGNOSIS — E119 Type 2 diabetes mellitus without complications: Secondary | ICD-10-CM | POA: Diagnosis not present

## 2019-01-16 DIAGNOSIS — F1210 Cannabis abuse, uncomplicated: Secondary | ICD-10-CM | POA: Diagnosis not present

## 2019-03-19 DIAGNOSIS — Z91018 Allergy to other foods: Secondary | ICD-10-CM | POA: Diagnosis not present

## 2019-03-19 DIAGNOSIS — Z794 Long term (current) use of insulin: Secondary | ICD-10-CM | POA: Diagnosis not present

## 2019-03-19 DIAGNOSIS — Z79899 Other long term (current) drug therapy: Secondary | ICD-10-CM | POA: Diagnosis not present

## 2019-03-19 DIAGNOSIS — E109 Type 1 diabetes mellitus without complications: Secondary | ICD-10-CM | POA: Diagnosis not present

## 2019-03-19 DIAGNOSIS — R197 Diarrhea, unspecified: Secondary | ICD-10-CM | POA: Diagnosis not present

## 2019-03-19 DIAGNOSIS — R112 Nausea with vomiting, unspecified: Secondary | ICD-10-CM | POA: Diagnosis not present

## 2019-03-19 DIAGNOSIS — F17200 Nicotine dependence, unspecified, uncomplicated: Secondary | ICD-10-CM | POA: Diagnosis not present

## 2019-03-19 DIAGNOSIS — R1110 Vomiting, unspecified: Secondary | ICD-10-CM | POA: Diagnosis not present

## 2019-03-19 DIAGNOSIS — R1032 Left lower quadrant pain: Secondary | ICD-10-CM | POA: Diagnosis not present

## 2019-03-19 DIAGNOSIS — K529 Noninfective gastroenteritis and colitis, unspecified: Secondary | ICD-10-CM | POA: Diagnosis not present

## 2019-03-19 DIAGNOSIS — I10 Essential (primary) hypertension: Secondary | ICD-10-CM | POA: Diagnosis not present

## 2019-03-20 DIAGNOSIS — I10 Essential (primary) hypertension: Secondary | ICD-10-CM | POA: Diagnosis not present

## 2019-03-20 DIAGNOSIS — R1032 Left lower quadrant pain: Secondary | ICD-10-CM | POA: Diagnosis not present

## 2019-03-20 DIAGNOSIS — Z794 Long term (current) use of insulin: Secondary | ICD-10-CM | POA: Diagnosis not present

## 2019-03-20 DIAGNOSIS — Z79899 Other long term (current) drug therapy: Secondary | ICD-10-CM | POA: Diagnosis not present

## 2019-03-20 DIAGNOSIS — Z9114 Patient's other noncompliance with medication regimen: Secondary | ICD-10-CM | POA: Diagnosis not present

## 2019-03-20 DIAGNOSIS — E109 Type 1 diabetes mellitus without complications: Secondary | ICD-10-CM | POA: Diagnosis not present

## 2019-03-20 DIAGNOSIS — F17200 Nicotine dependence, unspecified, uncomplicated: Secondary | ICD-10-CM | POA: Diagnosis not present

## 2019-03-20 DIAGNOSIS — K529 Noninfective gastroenteritis and colitis, unspecified: Secondary | ICD-10-CM | POA: Diagnosis not present

## 2019-03-21 ENCOUNTER — Emergency Department (HOSPITAL_COMMUNITY)
Admission: EM | Admit: 2019-03-21 | Discharge: 2019-03-21 | Disposition: A | Discharge status: Home / Self Care | Payer: BC Managed Care – PPO | Attending: Emergency Medicine | Admitting: Emergency Medicine

## 2019-03-21 ENCOUNTER — Emergency Department (HOSPITAL_COMMUNITY): Payer: BC Managed Care – PPO

## 2019-03-21 ENCOUNTER — Other Ambulatory Visit: Payer: Self-pay

## 2019-03-21 ENCOUNTER — Encounter (HOSPITAL_COMMUNITY): Payer: Self-pay | Admitting: Emergency Medicine

## 2019-03-21 DIAGNOSIS — R109 Unspecified abdominal pain: Secondary | ICD-10-CM | POA: Insufficient documentation

## 2019-03-21 DIAGNOSIS — R1013 Epigastric pain: Secondary | ICD-10-CM | POA: Diagnosis not present

## 2019-03-21 DIAGNOSIS — R112 Nausea with vomiting, unspecified: Secondary | ICD-10-CM

## 2019-03-21 DIAGNOSIS — R1011 Right upper quadrant pain: Secondary | ICD-10-CM | POA: Diagnosis not present

## 2019-03-21 DIAGNOSIS — R1084 Generalized abdominal pain: Secondary | ICD-10-CM | POA: Diagnosis not present

## 2019-03-21 DIAGNOSIS — M5489 Other dorsalgia: Secondary | ICD-10-CM | POA: Diagnosis not present

## 2019-03-21 DIAGNOSIS — N3289 Other specified disorders of bladder: Secondary | ICD-10-CM | POA: Diagnosis not present

## 2019-03-21 DIAGNOSIS — R52 Pain, unspecified: Secondary | ICD-10-CM | POA: Diagnosis not present

## 2019-03-21 LAB — CBC WITH DIFFERENTIAL/PLATELET
Abs Immature Granulocytes: 0.01 10*3/uL (ref 0.00–0.07)
Basophils Absolute: 0 10*3/uL (ref 0.0–0.1)
Basophils Relative: 0 %
Eosinophils Absolute: 0.1 10*3/uL (ref 0.0–0.5)
Eosinophils Relative: 2 %
HCT: 31.7 % — ABNORMAL LOW (ref 39.0–52.0)
Hemoglobin: 10.3 g/dL — ABNORMAL LOW (ref 13.0–17.0)
Immature Granulocytes: 0 %
Lymphocytes Relative: 18 %
Lymphs Abs: 1.1 10*3/uL (ref 0.7–4.0)
MCH: 30.7 pg (ref 26.0–34.0)
MCHC: 32.5 g/dL (ref 30.0–36.0)
MCV: 94.6 fL (ref 80.0–100.0)
Monocytes Absolute: 0.3 10*3/uL (ref 0.1–1.0)
Monocytes Relative: 5 %
Neutro Abs: 4.6 10*3/uL (ref 1.7–7.7)
Neutrophils Relative %: 75 %
Platelets: 236 10*3/uL (ref 150–400)
RBC: 3.35 MIL/uL — ABNORMAL LOW (ref 4.22–5.81)
RDW: 12.4 % (ref 11.5–15.5)
WBC: 6.2 10*3/uL (ref 4.0–10.5)
nRBC: 0 % (ref 0.0–0.2)

## 2019-03-21 LAB — URINALYSIS, ROUTINE W REFLEX MICROSCOPIC
Bacteria, UA: NONE SEEN
Bilirubin Urine: NEGATIVE
Glucose, UA: NEGATIVE mg/dL
Ketones, ur: NEGATIVE mg/dL
Leukocytes,Ua: NEGATIVE
Nitrite: NEGATIVE
Protein, ur: >=300 mg/dL — AB
Specific Gravity, Urine: 1.015 (ref 1.005–1.030)
pH: 6 (ref 5.0–8.0)

## 2019-03-21 LAB — COMPREHENSIVE METABOLIC PANEL
ALT: 14 U/L (ref 0–44)
AST: 18 U/L (ref 15–41)
Albumin: 3.7 g/dL (ref 3.5–5.0)
Alkaline Phosphatase: 86 U/L (ref 38–126)
Anion gap: 9 (ref 5–15)
BUN: 16 mg/dL (ref 6–20)
CO2: 24 mmol/L (ref 22–32)
Calcium: 8.5 mg/dL — ABNORMAL LOW (ref 8.9–10.3)
Chloride: 104 mmol/L (ref 98–111)
Creatinine, Ser: 1.23 mg/dL (ref 0.61–1.24)
GFR calc Af Amer: >60 mL/min (ref >60)
GFR calc non Af Amer: >60 mL/min (ref >60)
Glucose, Bld: 127 mg/dL — ABNORMAL HIGH (ref 70–99)
Potassium: 3.8 mmol/L (ref 3.5–5.1)
Sodium: 137 mmol/L (ref 135–145)
Total Bilirubin: 0.6 mg/dL (ref 0.3–1.2)
Total Protein: 6.7 g/dL (ref 6.5–8.1)

## 2019-03-21 LAB — LIPASE, BLOOD: Lipase: 31 U/L (ref 11–51)

## 2019-03-21 LAB — CBG MONITORING, ED: Glucose-Capillary: 136 mg/dL — ABNORMAL HIGH (ref 70–99)

## 2019-03-21 MED ORDER — ONDANSETRON HCL 4 MG/2ML IJ SOLN
4.0000 mg | Freq: Once | INTRAMUSCULAR | Status: AC
  Administered 2019-03-21: 4 mg via INTRAVENOUS
  Filled 2019-03-21: qty 2

## 2019-03-21 MED ORDER — IOHEXOL 300 MG/ML  SOLN
100.0000 mL | Freq: Once | INTRAMUSCULAR | Status: AC | PRN
  Administered 2019-03-21: 100 mL via INTRAVENOUS

## 2019-03-21 MED ORDER — SODIUM CHLORIDE 0.9 % IV BOLUS
1000.0000 mL | Freq: Once | INTRAVENOUS | Status: AC
  Administered 2019-03-21: 1000 mL via INTRAVENOUS

## 2019-03-21 MED ORDER — MORPHINE SULFATE (PF) 4 MG/ML IV SOLN
4.0000 mg | Freq: Once | INTRAVENOUS | Status: AC
  Administered 2019-03-21: 4 mg via INTRAVENOUS
  Filled 2019-03-21: qty 1

## 2019-03-21 MED ORDER — PROMETHAZINE HCL 25 MG/ML IJ SOLN
25.0000 mg | Freq: Once | INTRAMUSCULAR | Status: AC
  Administered 2019-03-21: 25 mg via INTRAVENOUS
  Filled 2019-03-21: qty 1

## 2019-03-21 MED ORDER — KETOROLAC TROMETHAMINE 30 MG/ML IJ SOLN
30.0000 mg | Freq: Once | INTRAMUSCULAR | Status: AC
  Administered 2019-03-21: 30 mg via INTRAVENOUS
  Filled 2019-03-21: qty 1

## 2019-03-21 MED ORDER — ONDANSETRON HCL 4 MG PO TABS: 4.0000 mg | ORAL_TABLET | Freq: Three times a day (TID) | ORAL | 0 refills | Status: DC | PRN

## 2019-03-21 NOTE — ED Notes (Signed)
Bladder scan shows 173ml.

## 2019-03-21 NOTE — ED Triage Notes (Signed)
Pt states that he has been having abd pain that goes to his back he has not been able to eat anything or take medications over the past four days.

## 2019-03-21 NOTE — Discharge Instructions (Addendum)
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
## 2019-03-26 ENCOUNTER — Encounter (HOSPITAL_COMMUNITY): Payer: Self-pay | Admitting: Emergency Medicine

## 2019-03-26 ENCOUNTER — Other Ambulatory Visit: Payer: Self-pay

## 2019-03-26 DIAGNOSIS — R1011 Right upper quadrant pain: Secondary | ICD-10-CM | POA: Insufficient documentation

## 2019-03-26 DIAGNOSIS — R319 Hematuria, unspecified: Secondary | ICD-10-CM | POA: Insufficient documentation

## 2019-03-26 DIAGNOSIS — E109 Type 1 diabetes mellitus without complications: Secondary | ICD-10-CM | POA: Insufficient documentation

## 2019-03-26 DIAGNOSIS — R109 Unspecified abdominal pain: Secondary | ICD-10-CM | POA: Diagnosis not present

## 2019-03-26 DIAGNOSIS — Z8249 Family history of ischemic heart disease and other diseases of the circulatory system: Secondary | ICD-10-CM | POA: Diagnosis not present

## 2019-03-26 DIAGNOSIS — R1031 Right lower quadrant pain: Secondary | ICD-10-CM | POA: Insufficient documentation

## 2019-03-26 DIAGNOSIS — R112 Nausea with vomiting, unspecified: Secondary | ICD-10-CM | POA: Diagnosis not present

## 2019-03-26 DIAGNOSIS — R1084 Generalized abdominal pain: Secondary | ICD-10-CM | POA: Diagnosis not present

## 2019-03-26 DIAGNOSIS — Z79899 Other long term (current) drug therapy: Secondary | ICD-10-CM | POA: Insufficient documentation

## 2019-03-26 DIAGNOSIS — I10 Essential (primary) hypertension: Secondary | ICD-10-CM | POA: Insufficient documentation

## 2019-03-26 DIAGNOSIS — Z5329 Procedure and treatment not carried out because of patient's decision for other reasons: Secondary | ICD-10-CM | POA: Diagnosis not present

## 2019-03-26 DIAGNOSIS — N179 Acute kidney failure, unspecified: Secondary | ICD-10-CM | POA: Diagnosis not present

## 2019-03-26 DIAGNOSIS — Z794 Long term (current) use of insulin: Secondary | ICD-10-CM | POA: Diagnosis not present

## 2019-03-26 DIAGNOSIS — Z833 Family history of diabetes mellitus: Secondary | ICD-10-CM | POA: Diagnosis not present

## 2019-03-26 DIAGNOSIS — R7989 Other specified abnormal findings of blood chemistry: Secondary | ICD-10-CM | POA: Diagnosis not present

## 2019-03-26 DIAGNOSIS — E7800 Pure hypercholesterolemia, unspecified: Secondary | ICD-10-CM | POA: Diagnosis not present

## 2019-03-26 DIAGNOSIS — Z03818 Encounter for observation for suspected exposure to other biological agents ruled out: Secondary | ICD-10-CM | POA: Diagnosis not present

## 2019-03-26 DIAGNOSIS — Z1159 Encounter for screening for other viral diseases: Secondary | ICD-10-CM | POA: Diagnosis not present

## 2019-03-26 DIAGNOSIS — F17210 Nicotine dependence, cigarettes, uncomplicated: Secondary | ICD-10-CM | POA: Insufficient documentation

## 2019-03-26 DIAGNOSIS — G8929 Other chronic pain: Secondary | ICD-10-CM | POA: Diagnosis not present

## 2019-03-26 NOTE — ED Triage Notes (Signed)
Pt c/o of abd pain and back pain, says that he was given medications but hasn't been able to keep anything down. Pt states that he was seen at [facility] and here for same within the last 2 weeks. Pt states that he ate pizza and breadsticks today then the pain started to get worse.

## 2019-03-27 ENCOUNTER — Other Ambulatory Visit: Payer: Self-pay

## 2019-03-27 ENCOUNTER — Encounter (HOSPITAL_COMMUNITY): Payer: Self-pay | Admitting: Emergency Medicine

## 2019-03-27 ENCOUNTER — Observation Stay (HOSPITAL_COMMUNITY)
Admission: EM | Admit: 2019-03-27 | Discharge: 2019-03-28 | Discharge status: Against Medical Advice | Payer: BC Managed Care – PPO | Attending: Internal Medicine | Admitting: Internal Medicine

## 2019-03-27 ENCOUNTER — Emergency Department (HOSPITAL_COMMUNITY)
Admission: EM | Admit: 2019-03-27 | Discharge: 2019-03-27 | Disposition: A | Discharge status: Home / Self Care | Payer: BC Managed Care – PPO | Source: Home / Self Care | Attending: Emergency Medicine | Admitting: Emergency Medicine

## 2019-03-27 ENCOUNTER — Emergency Department (HOSPITAL_COMMUNITY): Payer: BC Managed Care – PPO

## 2019-03-27 DIAGNOSIS — F17210 Nicotine dependence, cigarettes, uncomplicated: Secondary | ICD-10-CM | POA: Insufficient documentation

## 2019-03-27 DIAGNOSIS — Z79899 Other long term (current) drug therapy: Secondary | ICD-10-CM | POA: Insufficient documentation

## 2019-03-27 DIAGNOSIS — Z1159 Encounter for screening for other viral diseases: Secondary | ICD-10-CM | POA: Insufficient documentation

## 2019-03-27 DIAGNOSIS — Z8249 Family history of ischemic heart disease and other diseases of the circulatory system: Secondary | ICD-10-CM | POA: Insufficient documentation

## 2019-03-27 DIAGNOSIS — I10 Essential (primary) hypertension: Secondary | ICD-10-CM | POA: Insufficient documentation

## 2019-03-27 DIAGNOSIS — R7989 Other specified abnormal findings of blood chemistry: Secondary | ICD-10-CM | POA: Insufficient documentation

## 2019-03-27 DIAGNOSIS — E7800 Pure hypercholesterolemia, unspecified: Secondary | ICD-10-CM | POA: Insufficient documentation

## 2019-03-27 DIAGNOSIS — N179 Acute kidney failure, unspecified: Secondary | ICD-10-CM | POA: Insufficient documentation

## 2019-03-27 DIAGNOSIS — Z03818 Encounter for observation for suspected exposure to other biological agents ruled out: Secondary | ICD-10-CM | POA: Diagnosis not present

## 2019-03-27 DIAGNOSIS — Z794 Long term (current) use of insulin: Secondary | ICD-10-CM | POA: Insufficient documentation

## 2019-03-27 DIAGNOSIS — R109 Unspecified abdominal pain: Secondary | ICD-10-CM | POA: Diagnosis not present

## 2019-03-27 DIAGNOSIS — E109 Type 1 diabetes mellitus without complications: Secondary | ICD-10-CM | POA: Insufficient documentation

## 2019-03-27 DIAGNOSIS — Z5329 Procedure and treatment not carried out because of patient's decision for other reasons: Secondary | ICD-10-CM | POA: Insufficient documentation

## 2019-03-27 DIAGNOSIS — G8929 Other chronic pain: Secondary | ICD-10-CM | POA: Insufficient documentation

## 2019-03-27 DIAGNOSIS — R1084 Generalized abdominal pain: Secondary | ICD-10-CM

## 2019-03-27 DIAGNOSIS — Z833 Family history of diabetes mellitus: Secondary | ICD-10-CM | POA: Insufficient documentation

## 2019-03-27 DIAGNOSIS — R112 Nausea with vomiting, unspecified: Secondary | ICD-10-CM | POA: Insufficient documentation

## 2019-03-27 LAB — CBC
HCT: 29.7 % — ABNORMAL LOW (ref 39.0–52.0)
Hemoglobin: 10.2 g/dL — ABNORMAL LOW (ref 13.0–17.0)
MCH: 31.5 pg (ref 26.0–34.0)
MCHC: 34.3 g/dL (ref 30.0–36.0)
MCV: 91.7 fL (ref 80.0–100.0)
Platelets: 259 10*3/uL (ref 150–400)
RBC: 3.24 MIL/uL — ABNORMAL LOW (ref 4.22–5.81)
RDW: 12.5 % (ref 11.5–15.5)
WBC: 7 10*3/uL (ref 4.0–10.5)
nRBC: 0 % (ref 0.0–0.2)

## 2019-03-27 LAB — CBC WITH DIFFERENTIAL/PLATELET
Abs Immature Granulocytes: 0.02 10*3/uL (ref 0.00–0.07)
Basophils Absolute: 0 10*3/uL (ref 0.0–0.1)
Basophils Relative: 0 %
Eosinophils Absolute: 0 10*3/uL (ref 0.0–0.5)
Eosinophils Relative: 0 %
HCT: 29.3 % — ABNORMAL LOW (ref 39.0–52.0)
Hemoglobin: 9.7 g/dL — ABNORMAL LOW (ref 13.0–17.0)
Immature Granulocytes: 0 %
Lymphocytes Relative: 4 %
Lymphs Abs: 0.4 10*3/uL — ABNORMAL LOW (ref 0.7–4.0)
MCH: 30.8 pg (ref 26.0–34.0)
MCHC: 33.1 g/dL (ref 30.0–36.0)
MCV: 93 fL (ref 80.0–100.0)
Monocytes Absolute: 0.4 10*3/uL (ref 0.1–1.0)
Monocytes Relative: 3 %
Neutro Abs: 9.9 10*3/uL — ABNORMAL HIGH (ref 1.7–7.7)
Neutrophils Relative %: 93 %
Platelets: 263 10*3/uL (ref 150–400)
RBC: 3.15 MIL/uL — ABNORMAL LOW (ref 4.22–5.81)
RDW: 12.8 % (ref 11.5–15.5)
WBC: 10.7 10*3/uL — ABNORMAL HIGH (ref 4.0–10.5)
nRBC: 0 % (ref 0.0–0.2)

## 2019-03-27 LAB — RAPID URINE DRUG SCREEN, HOSP PERFORMED
Amphetamines: NOT DETECTED
Barbiturates: NOT DETECTED
Benzodiazepines: NOT DETECTED
Cocaine: NOT DETECTED
Opiates: POSITIVE — AB
Tetrahydrocannabinol: POSITIVE — AB

## 2019-03-27 LAB — COMPREHENSIVE METABOLIC PANEL
ALT: 13 U/L (ref 0–44)
ALT: 14 U/L (ref 0–44)
AST: 19 U/L (ref 15–41)
AST: 18 U/L (ref 15–41)
Albumin: 3.6 g/dL (ref 3.5–5.0)
Albumin: 3.7 g/dL (ref 3.5–5.0)
Alkaline Phosphatase: 78 U/L (ref 38–126)
Alkaline Phosphatase: 87 U/L (ref 38–126)
Anion gap: 8 (ref 5–15)
Anion gap: 10 (ref 5–15)
BUN: 18 mg/dL (ref 6–20)
BUN: 13 mg/dL (ref 6–20)
CO2: 24 mmol/L (ref 22–32)
CO2: 26 mmol/L (ref 22–32)
Calcium: 8.8 mg/dL — ABNORMAL LOW (ref 8.9–10.3)
Calcium: 9 mg/dL (ref 8.9–10.3)
Chloride: 103 mmol/L (ref 98–111)
Chloride: 103 mmol/L (ref 98–111)
Creatinine, Ser: 1.27 mg/dL — ABNORMAL HIGH (ref 0.61–1.24)
Creatinine, Ser: 1.36 mg/dL — ABNORMAL HIGH (ref 0.61–1.24)
GFR calc Af Amer: >60 mL/min (ref >60)
GFR calc Af Amer: >60 mL/min (ref >60)
GFR calc non Af Amer: >60 mL/min (ref >60)
GFR calc non Af Amer: >60 mL/min (ref >60)
Glucose, Bld: 315 mg/dL — ABNORMAL HIGH (ref 70–99)
Glucose, Bld: 162 mg/dL — ABNORMAL HIGH (ref 70–99)
Potassium: 4.4 mmol/L (ref 3.5–5.1)
Potassium: 3.6 mmol/L (ref 3.5–5.1)
Sodium: 135 mmol/L (ref 135–145)
Sodium: 139 mmol/L (ref 135–145)
Total Bilirubin: 0.4 mg/dL (ref 0.3–1.2)
Total Bilirubin: 0.8 mg/dL (ref 0.3–1.2)
Total Protein: 6.4 g/dL — ABNORMAL LOW (ref 6.5–8.1)
Total Protein: 6.9 g/dL (ref 6.5–8.1)

## 2019-03-27 LAB — URINALYSIS, ROUTINE W REFLEX MICROSCOPIC
Bacteria, UA: NONE SEEN
Bilirubin Urine: NEGATIVE
Bilirubin Urine: NEGATIVE
Glucose, UA: 150 mg/dL — AB
Glucose, UA: NEGATIVE mg/dL
Ketones, ur: NEGATIVE mg/dL
Ketones, ur: NEGATIVE mg/dL
Leukocytes,Ua: NEGATIVE
Leukocytes,Ua: NEGATIVE
Nitrite: NEGATIVE
Nitrite: NEGATIVE
Protein, ur: >=300 mg/dL — AB
Protein, ur: >=300 mg/dL — AB
RBC / HPF: >50 RBC/hpf — ABNORMAL HIGH (ref 0–5)
Specific Gravity, Urine: 1.029 (ref 1.005–1.030)
Specific Gravity, Urine: >1.046 — ABNORMAL HIGH (ref 1.005–1.030)
pH: 6 (ref 5.0–8.0)
pH: 5 (ref 5.0–8.0)

## 2019-03-27 LAB — LIPASE, BLOOD
Lipase: 20 U/L (ref 11–51)
Lipase: 21 U/L (ref 11–51)

## 2019-03-27 LAB — CBG MONITORING, ED
Glucose-Capillary: 169 mg/dL — ABNORMAL HIGH (ref 70–99)
Glucose-Capillary: 149 mg/dL — ABNORMAL HIGH (ref 70–99)

## 2019-03-27 MED ORDER — MORPHINE SULFATE (PF) 4 MG/ML IV SOLN
4.0000 mg | Freq: Once | INTRAVENOUS | Status: AC
  Administered 2019-03-27: 4 mg via INTRAVENOUS
  Filled 2019-03-27: qty 1

## 2019-03-27 MED ORDER — SODIUM CHLORIDE 0.9% FLUSH: 3.0000 mL | Freq: Once | INTRAVENOUS | Status: DC

## 2019-03-27 MED ORDER — ONDANSETRON 4 MG PO TBDP: 4.0000 mg | ORAL_TABLET | Freq: Three times a day (TID) | ORAL | 0 refills | Status: DC | PRN

## 2019-03-27 MED ORDER — IOHEXOL 300 MG/ML  SOLN
100.0000 mL | Freq: Once | INTRAMUSCULAR | Status: AC | PRN
  Administered 2019-03-27: 02:00:00 100 mL via INTRAVENOUS

## 2019-03-27 MED ORDER — SODIUM CHLORIDE 0.9 % IV BOLUS
1000.0000 mL | Freq: Once | INTRAVENOUS | Status: AC
  Administered 2019-03-27: 1000 mL via INTRAVENOUS

## 2019-03-27 MED ORDER — ONDANSETRON HCL 4 MG/2ML IJ SOLN
4.0000 mg | Freq: Once | INTRAMUSCULAR | Status: AC
  Administered 2019-03-27: 02:00:00 4 mg via INTRAVENOUS
  Filled 2019-03-27: qty 2
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
## 2019-03-27 NOTE — ED Triage Notes (Signed)
Pt c/o generalized abd pain, low back pain, gerd and emesis 7 x last 24 hours, s/s x 1 month. Pt seen at [facility] last night.

## 2019-03-27 NOTE — ED Notes (Signed)
ED Provider at bedside. 

## 2019-03-27 NOTE — Discharge Instructions (Signed)
Your testing is reassuring.  Take your medications as prescribed.  Stop using marijuana as it could be making your abdominal pain and vomiting worse.  Follow-up with your primary doctor as well as gastroenterologist.  Return to the ED with new or worsening symptoms.

## 2019-03-27 NOTE — ED Notes (Signed)
Checked pt CBG per his request 149

## 2019-03-28 DIAGNOSIS — N179 Acute kidney failure, unspecified: Secondary | ICD-10-CM | POA: Diagnosis not present

## 2019-03-28 DIAGNOSIS — R112 Nausea with vomiting, unspecified: Secondary | ICD-10-CM | POA: Diagnosis present

## 2019-03-28 DIAGNOSIS — R109 Unspecified abdominal pain: Secondary | ICD-10-CM

## 2019-03-28 DIAGNOSIS — Z5321 Procedure and treatment not carried out due to patient leaving prior to being seen by health care provider: Secondary | ICD-10-CM | POA: Diagnosis not present

## 2019-03-28 DIAGNOSIS — Z720 Tobacco use: Secondary | ICD-10-CM

## 2019-03-28 DIAGNOSIS — Z91018 Allergy to other foods: Secondary | ICD-10-CM

## 2019-03-28 DIAGNOSIS — G8929 Other chronic pain: Secondary | ICD-10-CM

## 2019-03-28 DIAGNOSIS — Z91030 Bee allergy status: Secondary | ICD-10-CM

## 2019-03-28 DIAGNOSIS — E119 Type 2 diabetes mellitus without complications: Secondary | ICD-10-CM

## 2019-03-28 DIAGNOSIS — M549 Dorsalgia, unspecified: Secondary | ICD-10-CM

## 2019-03-28 LAB — BASIC METABOLIC PANEL
Anion gap: 8 (ref 5–15)
BUN: 13 mg/dL (ref 6–20)
CO2: 23 mmol/L (ref 22–32)
Calcium: 7.9 mg/dL — ABNORMAL LOW (ref 8.9–10.3)
Chloride: 107 mmol/L (ref 98–111)
Creatinine, Ser: 1.13 mg/dL (ref 0.61–1.24)
GFR calc Af Amer: >60 mL/min (ref >60)
GFR calc non Af Amer: >60 mL/min (ref >60)
Glucose, Bld: 129 mg/dL — ABNORMAL HIGH (ref 70–99)
Potassium: 3.6 mmol/L (ref 3.5–5.1)
Sodium: 138 mmol/L (ref 135–145)

## 2019-03-28 LAB — RAPID URINE DRUG SCREEN, HOSP PERFORMED
Amphetamines: NOT DETECTED
Barbiturates: NOT DETECTED
Benzodiazepines: NOT DETECTED
Cocaine: NOT DETECTED
Opiates: POSITIVE — AB
Tetrahydrocannabinol: POSITIVE — AB

## 2019-03-28 LAB — HEMOGLOBIN A1C
Hgb A1c MFr Bld: 6.5 % — ABNORMAL HIGH (ref 4.8–5.6)
Mean Plasma Glucose: 139.85 mg/dL

## 2019-03-28 LAB — HIV ANTIBODY (ROUTINE TESTING W REFLEX): HIV Screen 4th Generation wRfx: NONREACTIVE

## 2019-03-28 LAB — SARS CORONAVIRUS 2 BY RT PCR (HOSPITAL ORDER, PERFORMED IN ~~LOC~~ HOSPITAL LAB): SARS Coronavirus 2: NEGATIVE

## 2019-03-28 MED ORDER — ACETAMINOPHEN 325 MG PO TABS: 650.0000 mg | ORAL_TABLET | Freq: Four times a day (QID) | ORAL | Status: DC | PRN

## 2019-03-28 MED ORDER — HALOPERIDOL LACTATE 5 MG/ML IJ SOLN
5.0000 mg | Freq: Once | INTRAMUSCULAR | Status: AC
  Administered 2019-03-28: 5 mg via INTRAVENOUS
  Filled 2019-03-28: qty 1

## 2019-03-28 MED ORDER — ACETAMINOPHEN 650 MG RE SUPP: 650.0000 mg | Freq: Four times a day (QID) | RECTAL | Status: DC | PRN

## 2019-03-28 MED ORDER — SODIUM CHLORIDE 0.9 % IV SOLN
1000.0000 mL | INTRAVENOUS | Status: DC
  Administered 2019-03-28: 05:00:00 1000 mL via INTRAVENOUS

## 2019-03-28 MED ORDER — INSULIN ASPART 100 UNIT/ML ~~LOC~~ SOLN: 0.0000 [IU] | Freq: Every day | SUBCUTANEOUS | Status: DC

## 2019-03-28 MED ORDER — INSULIN ASPART 100 UNIT/ML ~~LOC~~ SOLN: 0.0000 [IU] | Freq: Three times a day (TID) | SUBCUTANEOUS | Status: DC

## 2019-03-28 MED ORDER — ONDANSETRON HCL 4 MG PO TABS: 4.0000 mg | ORAL_TABLET | Freq: Four times a day (QID) | ORAL | Status: DC | PRN

## 2019-03-28 MED ORDER — SODIUM CHLORIDE 0.9 % IV BOLUS (SEPSIS)
1000.0000 mL | Freq: Once | INTRAVENOUS | Status: AC
  Administered 2019-03-28: 06:00:00 1000 mL via INTRAVENOUS

## 2019-03-28 MED ORDER — ONDANSETRON HCL 4 MG/2ML IJ SOLN
4.0000 mg | Freq: Once | INTRAMUSCULAR | Status: AC
  Administered 2019-03-28: 4 mg via INTRAVENOUS
  Filled 2019-03-28: qty 2

## 2019-03-28 MED ORDER — CAPSAICIN 0.025 % EX CREA
TOPICAL_CREAM | Freq: Two times a day (BID) | CUTANEOUS | Status: DC
  Filled 2019-03-28: qty 60

## 2019-03-28 MED ORDER — ENOXAPARIN SODIUM 40 MG/0.4ML ~~LOC~~ SOLN: 40.0000 mg | Freq: Every day | SUBCUTANEOUS | Status: DC

## 2019-03-28 MED ORDER — SODIUM CHLORIDE 0.9 % IV BOLUS (SEPSIS)
1000.0000 mL | Freq: Once | INTRAVENOUS | Status: AC
  Administered 2019-03-28: 04:00:00 1000 mL via INTRAVENOUS

## 2019-03-28 MED ORDER — ONDANSETRON HCL 4 MG/2ML IJ SOLN: 4.0000 mg | Freq: Four times a day (QID) | INTRAMUSCULAR | Status: DC | PRN

## 2019-03-28 NOTE — ED Notes (Signed)
Admitting MD at bedside.
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
## 2019-03-28 NOTE — ED Notes (Signed)
Patient insisted that he wanted to leave AMA. Pt counseled to stay by the providers earlier, but decided to leave anyway.

## 2019-04-19 DIAGNOSIS — I10 Essential (primary) hypertension: Secondary | ICD-10-CM | POA: Diagnosis not present

## 2019-04-19 DIAGNOSIS — R109 Unspecified abdominal pain: Secondary | ICD-10-CM | POA: Diagnosis not present

## 2019-04-19 DIAGNOSIS — F17210 Nicotine dependence, cigarettes, uncomplicated: Secondary | ICD-10-CM | POA: Diagnosis not present

## 2019-04-19 DIAGNOSIS — Z79899 Other long term (current) drug therapy: Secondary | ICD-10-CM | POA: Diagnosis not present

## 2019-04-19 DIAGNOSIS — R1084 Generalized abdominal pain: Secondary | ICD-10-CM | POA: Diagnosis not present

## 2019-04-19 DIAGNOSIS — Z794 Long term (current) use of insulin: Secondary | ICD-10-CM | POA: Diagnosis not present

## 2019-04-19 DIAGNOSIS — E1065 Type 1 diabetes mellitus with hyperglycemia: Secondary | ICD-10-CM | POA: Diagnosis not present

## 2019-04-19 DIAGNOSIS — R112 Nausea with vomiting, unspecified: Secondary | ICD-10-CM | POA: Diagnosis not present

## 2019-04-20 DIAGNOSIS — I10 Essential (primary) hypertension: Secondary | ICD-10-CM | POA: Diagnosis not present

## 2019-04-20 DIAGNOSIS — R112 Nausea with vomiting, unspecified: Secondary | ICD-10-CM | POA: Diagnosis not present

## 2019-04-20 DIAGNOSIS — N289 Disorder of kidney and ureter, unspecified: Secondary | ICD-10-CM | POA: Diagnosis not present

## 2019-04-20 DIAGNOSIS — R109 Unspecified abdominal pain: Secondary | ICD-10-CM | POA: Diagnosis not present

## 2019-04-20 DIAGNOSIS — F17200 Nicotine dependence, unspecified, uncomplicated: Secondary | ICD-10-CM | POA: Diagnosis not present

## 2019-04-20 DIAGNOSIS — Z794 Long term (current) use of insulin: Secondary | ICD-10-CM | POA: Diagnosis not present

## 2019-04-20 DIAGNOSIS — Z5329 Procedure and treatment not carried out because of patient's decision for other reasons: Secondary | ICD-10-CM | POA: Diagnosis not present

## 2019-04-20 DIAGNOSIS — Z79899 Other long term (current) drug therapy: Secondary | ICD-10-CM | POA: Diagnosis not present

## 2019-04-20 DIAGNOSIS — E109 Type 1 diabetes mellitus without complications: Secondary | ICD-10-CM | POA: Diagnosis not present

## 2019-05-05 ENCOUNTER — Other Ambulatory Visit: Payer: Self-pay

## 2019-05-05 ENCOUNTER — Ambulatory Visit: Payer: Self-pay | Admitting: Family Medicine

## 2019-06-02 ENCOUNTER — Ambulatory Visit: Payer: BC Managed Care – PPO | Admitting: Family Medicine
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
## 2019-08-16 ENCOUNTER — Other Ambulatory Visit: Payer: Self-pay

## 2019-08-16 ENCOUNTER — Encounter: Payer: Self-pay | Admitting: Family Medicine

## 2019-08-16 ENCOUNTER — Ambulatory Visit (INDEPENDENT_AMBULATORY_CARE_PROVIDER_SITE_OTHER): Payer: BC Managed Care – PPO | Admitting: Family Medicine

## 2019-08-16 VITALS — BP 127/88 | HR 104 | Temp 96.6°F | Ht 74.0 in | Wt 184.0 lb

## 2019-08-16 DIAGNOSIS — N644 Mastodynia: Secondary | ICD-10-CM

## 2019-08-16 DIAGNOSIS — I10 Essential (primary) hypertension: Secondary | ICD-10-CM

## 2019-08-16 DIAGNOSIS — M25462 Effusion, left knee: Secondary | ICD-10-CM

## 2019-08-16 DIAGNOSIS — M542 Cervicalgia: Secondary | ICD-10-CM

## 2019-08-16 DIAGNOSIS — E119 Type 2 diabetes mellitus without complications: Secondary | ICD-10-CM | POA: Diagnosis not present

## 2019-08-16 DIAGNOSIS — Z23 Encounter for immunization: Secondary | ICD-10-CM | POA: Diagnosis not present

## 2019-08-16 DIAGNOSIS — Z130 Encounter for screening for diseases of the blood and blood-forming organs and certain disorders involving the immune mechanism: Secondary | ICD-10-CM | POA: Diagnosis not present

## 2019-08-16 DIAGNOSIS — N6310 Unspecified lump in the right breast, unspecified quadrant: Secondary | ICD-10-CM

## 2019-08-16 DIAGNOSIS — E7800 Pure hypercholesterolemia, unspecified: Secondary | ICD-10-CM | POA: Diagnosis not present

## 2019-08-16 LAB — BAYER DCA HB A1C WAIVED: HB A1C (BAYER DCA - WAIVED): 8.1 % — ABNORMAL HIGH (ref <7.0)

## 2019-08-16 MED ORDER — LISINOPRIL 10 MG PO TABS: 10.0000 mg | ORAL_TABLET | Freq: Every day | ORAL | 1 refills | Status: DC

## 2019-08-16 MED ORDER — TIZANIDINE HCL 2 MG PO CAPS: 2.0000 mg | ORAL_CAPSULE | Freq: Three times a day (TID) | ORAL | 2 refills | Status: DC | PRN

## 2019-08-16 NOTE — Patient Instructions (Addendum)
Horse Liniment Ointment Heat Ibuprofen 400-600 mg every 6 hours as needed   Knee Effusion  Knee effusion means that you have extra fluid in your knee. This can cause pain. Your knee may be more difficult to bend and move. What are the causes? Common causes are:  Arthritis.  Infection.  Injury.  Autoimmune disease. This means that your body's defense system (immune system) mistakenly attacks healthy body tissues. Follow these instructions at home: Medicines  Take medicines only as told by your doctor.  Do not drive or use heavy machinery while taking prescription pain medicine.  If you are taking prescription pain medicine, take actions to help prevent or treat trouble pooping (constipation). Your doctor may recommend that you: ? Drink enough fluid to keep your pee (urine) pale yellow. ? Eat foods that are high in fiber. These include fresh fruits and vegetables, whole grains, and beans. ? Avoid eating fatty or sweet foods. ? Take a medicine for constipation. If you have a brace:  Wear the brace as told by your doctor. Remove it only as told by your doctor.  Loosen the brace if your toes tingle, become numb, or turn cold and blue.  Keep the brace clean.  If the brace is not waterproof: ? Do not let it get wet. ? Cover it with a watertight covering when you take a bath or a shower. Managing pain, stiffness, and swelling   If told, apply ice to the swollen area: ? If you have a removable brace, remove it as told by your doctor. ? Put ice in a plastic bag. ? Place a towel between your skin and the bag. ? Leave the ice on for 20 minutes, 2-3 times per day.  Keep your knee raised (elevated) when you are sitting or lying down. General instructions  Do not use any products that contain nicotine or tobacco, such as cigarettes and e-cigarettes. These can delay healing. If you need help quitting, ask your doctor.  Use crutches as told by your doctor.  Do exercises as  told by your doctor.  Rest as told by your doctor.  Keep all follow-up visits as told by your doctor. This is important. Contact a doctor if you:  Continue to have pain in your knee. Get help right away if you:  Have swelling or redness of your knee that gets worse or does not get better.  Have serious pain in your knee.  Have a fever. Summary  Knee effusion is when you have extra fluid in your knee. This causes pain and swelling and makes it hard to bend and move your knee.  Take medicines only as told by your doctor.  If you have a brace, wear the brace as told by your doctor. This information is not intended to replace advice given to you by your health care provider. Make sure you discuss any questions you have with your health care provider. Document Released: 09/27/2010 Document Revised: 09/07/2017 Document Reviewed: 09/06/2017 Elsevier Patient Education  2020 [employer].

## 2019-08-17 ENCOUNTER — Other Ambulatory Visit: Payer: Self-pay | Admitting: Family Medicine

## 2019-08-17 ENCOUNTER — Other Ambulatory Visit: Payer: Self-pay | Admitting: *Deleted

## 2019-08-17 DIAGNOSIS — E7800 Pure hypercholesterolemia, unspecified: Secondary | ICD-10-CM

## 2019-08-17 DIAGNOSIS — E875 Hyperkalemia: Secondary | ICD-10-CM

## 2019-08-17 LAB — MICROALBUMIN / CREATININE URINE RATIO
Creatinine, Urine: 195.5 mg/dL
Microalb/Creat Ratio: 2404 mg/g creat — ABNORMAL HIGH (ref 0–29)
Microalbumin, Urine: 4699.4 ug/mL

## 2019-08-17 MED ORDER — ATORVASTATIN CALCIUM 10 MG PO TABS: 10.0000 mg | ORAL_TABLET | Freq: Every day | ORAL | 2 refills | Status: DC

## 2019-08-18 ENCOUNTER — Telehealth: Payer: Self-pay | Admitting: *Deleted

## 2019-08-18 LAB — GLUTAMIC ACID DECARBOXYLASE AUTO ABS: Glutamic Acid Decarb Ab: <5 U/mL (ref 0.0–5.0)

## 2019-08-18 LAB — CBC WITH DIFFERENTIAL/PLATELET
Basophils Absolute: 0 10*3/uL (ref 0.0–0.2)
Basos: 1 %
EOS (ABSOLUTE): 0.2 10*3/uL (ref 0.0–0.4)
Eos: 5 %
Hematocrit: 30.1 % — ABNORMAL LOW (ref 37.5–51.0)
Hemoglobin: 10.3 g/dL — ABNORMAL LOW (ref 13.0–17.7)
Immature Grans (Abs): 0 10*3/uL (ref 0.0–0.1)
Immature Granulocytes: 0 %
Lymphocytes Absolute: 1.6 10*3/uL (ref 0.7–3.1)
Lymphs: 43 %
MCH: 29.4 pg (ref 26.6–33.0)
MCHC: 34.2 g/dL (ref 31.5–35.7)
MCV: 86 fL (ref 79–97)
Monocytes Absolute: 0.3 10*3/uL (ref 0.1–0.9)
Monocytes: 9 %
Neutrophils Absolute: 1.5 10*3/uL (ref 1.4–7.0)
Neutrophils: 42 %
Platelets: 240 10*3/uL (ref 150–450)
RBC: 3.5 x10E6/uL — ABNORMAL LOW (ref 4.14–5.80)
RDW: 12.5 % (ref 11.6–15.4)
WBC: 3.6 10*3/uL (ref 3.4–10.8)

## 2019-08-18 LAB — CMP14+EGFR
ALT: 20 IU/L (ref 0–44)
AST: 22 IU/L (ref 0–40)
Albumin/Globulin Ratio: 1.5 (ref 1.2–2.2)
Albumin: 3.8 g/dL — ABNORMAL LOW (ref 4.1–5.2)
Alkaline Phosphatase: 127 IU/L — ABNORMAL HIGH (ref 39–117)
BUN/Creatinine Ratio: 15 (ref 9–20)
BUN: 19 mg/dL (ref 6–20)
Bilirubin Total: 0.2 mg/dL (ref 0.0–1.2)
CO2: 18 mmol/L — ABNORMAL LOW (ref 20–29)
Calcium: 8.8 mg/dL (ref 8.7–10.2)
Chloride: 108 mmol/L — ABNORMAL HIGH (ref 96–106)
Creatinine, Ser: 1.3 mg/dL — ABNORMAL HIGH (ref 0.76–1.27)
GFR calc Af Amer: 85 mL/min/{1.73_m2} (ref >59)
GFR calc non Af Amer: 74 mL/min/{1.73_m2} (ref >59)
Globulin, Total: 2.6 g/dL (ref 1.5–4.5)
Glucose: 154 mg/dL — ABNORMAL HIGH (ref 65–99)
Potassium: 6 mmol/L (ref 3.5–5.2)
Sodium: 138 mmol/L (ref 134–144)
Total Protein: 6.4 g/dL (ref 6.0–8.5)

## 2019-08-18 LAB — LIPID PANEL
Chol/HDL Ratio: 5.8 ratio — ABNORMAL HIGH (ref 0.0–5.0)
Cholesterol, Total: 215 mg/dL — ABNORMAL HIGH (ref 100–199)
HDL: 37 mg/dL — ABNORMAL LOW (ref >39)
LDL Chol Calc (NIH): 152 mg/dL — ABNORMAL HIGH (ref 0–99)
Triglycerides: 141 mg/dL (ref 0–149)
VLDL Cholesterol Cal: 26 mg/dL (ref 5–40)

## 2019-08-18 NOTE — Telephone Encounter (Addendum)
Prior Auth for Tizanidine HCL 2mg  caps-DENIED  This medication is not on the formulary and is approved when two alternative medications on the formulary have been tried and did not work: Metaxalone 800mg , methocarbamol, tizanidine tablet  Key: [id number]   Your information has been submitted to [place]. [employer] will review the request and fax you a determination directly, typically within 3 business days of your submission once all necessary information is received.  If [employer] [place] has not responded in 3 business days or if you have any questions about your submission, contact [place] at [phone].

## 2019-08-19 ENCOUNTER — Other Ambulatory Visit: Payer: Self-pay | Admitting: Family Medicine

## 2019-08-19 DIAGNOSIS — N6310 Unspecified lump in the right breast, unspecified quadrant: Secondary | ICD-10-CM

## 2019-08-19 DIAGNOSIS — N644 Mastodynia: Secondary | ICD-10-CM

## 2019-08-22 ENCOUNTER — Other Ambulatory Visit: Payer: Self-pay | Admitting: Family Medicine

## 2019-08-22 DIAGNOSIS — E119 Type 2 diabetes mellitus without complications: Secondary | ICD-10-CM

## 2019-08-22 MED ORDER — METFORMIN HCL 500 MG PO TABS: 500.0000 mg | ORAL_TABLET | Freq: Two times a day (BID) | ORAL | 2 refills | Status: DC

## 2019-08-23 MED ORDER — TIZANIDINE HCL 2 MG PO TABS: 2.0000 mg | ORAL_TABLET | Freq: Three times a day (TID) | ORAL | 0 refills | Status: DC | PRN

## 2019-08-23 NOTE — Telephone Encounter (Signed)
Tablet form sent

## 2019-08-28 DIAGNOSIS — K3184 Gastroparesis: Secondary | ICD-10-CM | POA: Diagnosis not present

## 2019-08-28 DIAGNOSIS — Z794 Long term (current) use of insulin: Secondary | ICD-10-CM | POA: Diagnosis not present

## 2019-08-28 DIAGNOSIS — E869 Volume depletion, unspecified: Secondary | ICD-10-CM | POA: Diagnosis not present

## 2019-08-28 DIAGNOSIS — R1013 Epigastric pain: Secondary | ICD-10-CM | POA: Diagnosis not present

## 2019-08-28 DIAGNOSIS — Z79899 Other long term (current) drug therapy: Secondary | ICD-10-CM | POA: Diagnosis not present

## 2019-08-28 DIAGNOSIS — E1043 Type 1 diabetes mellitus with diabetic autonomic (poly)neuropathy: Secondary | ICD-10-CM | POA: Diagnosis not present

## 2019-08-28 DIAGNOSIS — F17200 Nicotine dependence, unspecified, uncomplicated: Secondary | ICD-10-CM | POA: Diagnosis not present

## 2019-08-28 DIAGNOSIS — R1110 Vomiting, unspecified: Secondary | ICD-10-CM | POA: Diagnosis not present

## 2019-08-28 DIAGNOSIS — K2900 Acute gastritis without bleeding: Secondary | ICD-10-CM | POA: Diagnosis not present

## 2019-08-28 DIAGNOSIS — I10 Essential (primary) hypertension: Secondary | ICD-10-CM | POA: Diagnosis not present

## 2019-08-28 DIAGNOSIS — E109 Type 1 diabetes mellitus without complications: Secondary | ICD-10-CM | POA: Diagnosis not present

## 2019-08-29 ENCOUNTER — Telehealth: Payer: Self-pay | Admitting: Family Medicine

## 2019-08-29 NOTE — Telephone Encounter (Signed)
-----  Message from [employer] sent at 08/29/2019  5:36 AM EST ----- Regarding: Cancellation of Order # [id number] Order number [id number] for the procedure BMP8+EGFR [[id number]] has  been canceled by [employer] [[id number]]. This procedure was  ordered by Loman Brooklyn, FNP [[id number]] on Aug 17, 2019  for the patient Andrew Morales [[id number]]. The reason for  cancellation was "Order Expired".  This was a future order.

## 2019-08-29 NOTE — Telephone Encounter (Signed)
Can we please reach out to patient and encouraged him to come back for BMP due to elevated potassium?

## 2019-08-30 ENCOUNTER — Encounter (HOSPITAL_COMMUNITY): Payer: Self-pay

## 2019-08-30 ENCOUNTER — Other Ambulatory Visit: Payer: Self-pay

## 2019-08-30 ENCOUNTER — Emergency Department (HOSPITAL_COMMUNITY): Payer: BC Managed Care – PPO

## 2019-08-30 ENCOUNTER — Telehealth: Payer: Self-pay | Admitting: Family Medicine

## 2019-08-30 ENCOUNTER — Emergency Department (HOSPITAL_COMMUNITY)
Admission: EM | Admit: 2019-08-30 | Discharge: 2019-08-30 | Disposition: A | Discharge status: Home / Self Care | Payer: BC Managed Care – PPO | Attending: Emergency Medicine | Admitting: Emergency Medicine

## 2019-08-30 DIAGNOSIS — R1110 Vomiting, unspecified: Secondary | ICD-10-CM | POA: Diagnosis not present

## 2019-08-30 DIAGNOSIS — F17210 Nicotine dependence, cigarettes, uncomplicated: Secondary | ICD-10-CM | POA: Insufficient documentation

## 2019-08-30 DIAGNOSIS — Z7984 Long term (current) use of oral hypoglycemic drugs: Secondary | ICD-10-CM | POA: Insufficient documentation

## 2019-08-30 DIAGNOSIS — E109 Type 1 diabetes mellitus without complications: Secondary | ICD-10-CM | POA: Diagnosis not present

## 2019-08-30 DIAGNOSIS — R112 Nausea with vomiting, unspecified: Secondary | ICD-10-CM | POA: Insufficient documentation

## 2019-08-30 DIAGNOSIS — Z79899 Other long term (current) drug therapy: Secondary | ICD-10-CM | POA: Insufficient documentation

## 2019-08-30 DIAGNOSIS — I10 Essential (primary) hypertension: Secondary | ICD-10-CM | POA: Diagnosis not present

## 2019-08-30 DIAGNOSIS — R1084 Generalized abdominal pain: Secondary | ICD-10-CM

## 2019-08-30 LAB — URINALYSIS, ROUTINE W REFLEX MICROSCOPIC
Bilirubin Urine: NEGATIVE
Glucose, UA: NEGATIVE mg/dL
Ketones, ur: 5 mg/dL — AB
Leukocytes,Ua: NEGATIVE
Nitrite: NEGATIVE
Protein, ur: >=300 mg/dL — AB
Specific Gravity, Urine: 1.02 (ref 1.005–1.030)
pH: 7 (ref 5.0–8.0)

## 2019-08-30 LAB — COMPREHENSIVE METABOLIC PANEL
ALT: 16 U/L (ref 0–44)
AST: 18 U/L (ref 15–41)
Albumin: 3.4 g/dL — ABNORMAL LOW (ref 3.5–5.0)
Alkaline Phosphatase: 94 U/L (ref 38–126)
Anion gap: 9 (ref 5–15)
BUN: 16 mg/dL (ref 6–20)
CO2: 25 mmol/L (ref 22–32)
Calcium: 8.6 mg/dL — ABNORMAL LOW (ref 8.9–10.3)
Chloride: 105 mmol/L (ref 98–111)
Creatinine, Ser: 1.22 mg/dL (ref 0.61–1.24)
GFR calc Af Amer: >60 mL/min (ref >60)
GFR calc non Af Amer: >60 mL/min (ref >60)
Glucose, Bld: 169 mg/dL — ABNORMAL HIGH (ref 70–99)
Potassium: 4.6 mmol/L (ref 3.5–5.1)
Sodium: 139 mmol/L (ref 135–145)
Total Bilirubin: 0.6 mg/dL (ref 0.3–1.2)
Total Protein: 6.6 g/dL (ref 6.5–8.1)

## 2019-08-30 LAB — RAPID URINE DRUG SCREEN, HOSP PERFORMED
Amphetamines: NOT DETECTED
Barbiturates: NOT DETECTED
Benzodiazepines: NOT DETECTED
Cocaine: NOT DETECTED
Opiates: NOT DETECTED
Tetrahydrocannabinol: POSITIVE — AB

## 2019-08-30 LAB — LIPASE, BLOOD: Lipase: 22 U/L (ref 11–51)

## 2019-08-30 LAB — POC OCCULT BLOOD, ED: Fecal Occult Bld: NEGATIVE

## 2019-08-30 LAB — CBC
HCT: 28.5 % — ABNORMAL LOW (ref 39.0–52.0)
Hemoglobin: 9.7 g/dL — ABNORMAL LOW (ref 13.0–17.0)
MCH: 30.8 pg (ref 26.0–34.0)
MCHC: 34 g/dL (ref 30.0–36.0)
MCV: 90.5 fL (ref 80.0–100.0)
Platelets: 198 10*3/uL (ref 150–400)
RBC: 3.15 MIL/uL — ABNORMAL LOW (ref 4.22–5.81)
RDW: 12.3 % (ref 11.5–15.5)
WBC: 4.2 10*3/uL (ref 4.0–10.5)
nRBC: 0 % (ref 0.0–0.2)

## 2019-08-30 LAB — CBG MONITORING, ED: Glucose-Capillary: 179 mg/dL — ABNORMAL HIGH (ref 70–99)

## 2019-08-30 MED ORDER — PROMETHAZINE HCL 25 MG RE SUPP: 25.0000 mg | Freq: Four times a day (QID) | RECTAL | 0 refills | Status: DC | PRN

## 2019-08-30 MED ORDER — DIPHENHYDRAMINE HCL 50 MG/ML IJ SOLN
12.5000 mg | Freq: Once | INTRAMUSCULAR | Status: AC
  Administered 2019-08-30: 12.5 mg via INTRAVENOUS
  Filled 2019-08-30: qty 1

## 2019-08-30 MED ORDER — SODIUM CHLORIDE 0.9 % IV BOLUS
1000.0000 mL | Freq: Once | INTRAVENOUS | Status: AC
  Administered 2019-08-30: 1000 mL via INTRAVENOUS

## 2019-08-30 MED ORDER — PROMETHAZINE HCL 25 MG/ML IJ SOLN
12.5000 mg | Freq: Once | INTRAMUSCULAR | Status: DC
  Filled 2019-08-30: qty 1

## 2019-08-30 MED ORDER — METOCLOPRAMIDE HCL 5 MG/ML IJ SOLN
10.0000 mg | Freq: Once | INTRAMUSCULAR | Status: AC
  Administered 2019-08-30: 14:00:00 10 mg via INTRAVENOUS
  Filled 2019-08-30: qty 2

## 2019-08-30 MED ORDER — HALOPERIDOL LACTATE 5 MG/ML IJ SOLN
5.0000 mg | Freq: Once | INTRAMUSCULAR | Status: AC
  Administered 2019-08-30: 5 mg via INTRAVENOUS
  Filled 2019-08-30: qty 1

## 2019-08-30 NOTE — Telephone Encounter (Signed)
I do hope he gets to feeling better. No need to repeat BMP now as he has had this completed in the hospital.
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
## 2019-08-30 NOTE — Telephone Encounter (Signed)
In hospital. Lab no longer needed.

## 2019-08-30 NOTE — Telephone Encounter (Signed)
lmtcb

## 2019-08-30 NOTE — ED Notes (Signed)
Vomited brown colored fluid

## 2019-08-30 NOTE — ED Triage Notes (Addendum)
Pt is a type 2 diabetic. History of gastroparesis. Has been vomiting since Sunday. Went to [facility] for this and was given fluids, nausea medicine, and pain medicine with no relief. Pt has vomited today. CBG 179. Takes Metformin. Smokes marijuana daily for appetite.

## 2019-08-30 NOTE — ED Notes (Signed)
Up tp bathroom, states he needs to have a bowel movement

## 2019-08-30 NOTE — ED Notes (Signed)
Noted to have two phones up to ears and is in no distress. While I was present hung up the phones and began moaning and groaning

## 2019-08-30 NOTE — Telephone Encounter (Signed)
Attempted to contact - NA 

## 2019-08-30 NOTE — Discharge Instructions (Addendum)
You were seen in the emergency department today for vomiting and abdominal pain.  This improved following medications in the ER.  Your blood sugar was somewhat high, your urine had ketones, glucose, protein, and blood in it, please have each of these labs retested by primary care within 1 week.  We are sending home with Phenergan suppositories to use 1 every 6 hours as needed for nausea or vomiting.  We have prescribed you new medication(s) today. Discuss the medications prescribed today with your pharmacist as they can have adverse effects and interactions with your other medicines including over the counter and prescribed medications. Seek medical evaluation if you start to experience new or abnormal symptoms after taking one of these medicines, seek care immediately if you start to experience difficulty breathing, feeling of your throat closing, facial swelling, or rash as these could be indications of a more serious allergic reaction  Please try to stop smoking marijuana as this is potentially contributing to your vomiting.  Please follow-up with your primary care provider within 3 days.  Return to the ER for new or worsening symptoms including but not limited to inability to keep fluids down, blood in your vomit, increased pain, change in location of pain, blood in your stool, passing out, fever, or any other concerns.

## 2019-08-30 NOTE — Telephone Encounter (Signed)
Attempted to return call- NA  FYI

## 2019-09-05 ENCOUNTER — Telehealth: Payer: Self-pay | Admitting: Family Medicine

## 2019-09-05 NOTE — Telephone Encounter (Signed)
Covering PCP- please advise  

## 2019-09-05 NOTE — Telephone Encounter (Signed)
Patient aware and verbalized understanding. °

## 2019-09-05 NOTE — Telephone Encounter (Signed)
Potassium was noted to be normal at discharge from the hospital.  Unless he is having unusual symptoms, this does not need to be checked.

## 2019-09-12 ENCOUNTER — Other Ambulatory Visit (HOSPITAL_COMMUNITY): Payer: Self-pay | Admitting: Family Medicine

## 2019-09-12 DIAGNOSIS — R928 Other abnormal and inconclusive findings on diagnostic imaging of breast: Secondary | ICD-10-CM

## 2019-09-13 ENCOUNTER — Other Ambulatory Visit: Payer: Self-pay

## 2019-09-13 ENCOUNTER — Ambulatory Visit (HOSPITAL_COMMUNITY)
Admission: RE | Admit: 2019-09-13 | Discharge: 2019-09-13 | Disposition: A | Discharge status: Home / Self Care | Payer: Self-pay | Source: Ambulatory Visit | Attending: Family Medicine | Admitting: Family Medicine

## 2019-09-13 ENCOUNTER — Ambulatory Visit (HOSPITAL_COMMUNITY): Payer: Self-pay

## 2019-09-13 DIAGNOSIS — N6310 Unspecified lump in the right breast, unspecified quadrant: Secondary | ICD-10-CM | POA: Insufficient documentation

## 2019-09-13 DIAGNOSIS — N644 Mastodynia: Secondary | ICD-10-CM | POA: Insufficient documentation

## 2019-09-20 ENCOUNTER — Encounter (HOSPITAL_COMMUNITY): Payer: BC Managed Care – PPO

## 2019-09-20 ENCOUNTER — Other Ambulatory Visit (HOSPITAL_COMMUNITY): Payer: BC Managed Care – PPO

## 2019-09-24 NOTE — Progress Notes (Signed)
  Unable to reach patient after multiple attempts. Message left to return call to office for visit.

## 2019-09-27 ENCOUNTER — Telehealth: Payer: Self-pay | Admitting: Family Medicine

## 2019-09-27 ENCOUNTER — Encounter (INDEPENDENT_AMBULATORY_CARE_PROVIDER_SITE_OTHER): Payer: BC Managed Care – PPO | Admitting: Family Medicine

## 2019-09-27 NOTE — Telephone Encounter (Signed)
noted 

## 2019-10-05 ENCOUNTER — Emergency Department (HOSPITAL_BASED_OUTPATIENT_CLINIC_OR_DEPARTMENT_OTHER)
Admission: EM | Admit: 2019-10-05 | Discharge: 2019-10-05 | Disposition: A | Discharge status: Home / Self Care | Attending: Emergency Medicine | Admitting: Emergency Medicine

## 2019-10-05 ENCOUNTER — Encounter (HOSPITAL_BASED_OUTPATIENT_CLINIC_OR_DEPARTMENT_OTHER): Payer: Self-pay | Admitting: Emergency Medicine

## 2019-10-05 ENCOUNTER — Emergency Department (HOSPITAL_BASED_OUTPATIENT_CLINIC_OR_DEPARTMENT_OTHER)

## 2019-10-05 ENCOUNTER — Other Ambulatory Visit: Payer: Self-pay

## 2019-10-05 DIAGNOSIS — Z91018 Allergy to other foods: Secondary | ICD-10-CM | POA: Insufficient documentation

## 2019-10-05 DIAGNOSIS — Z79899 Other long term (current) drug therapy: Secondary | ICD-10-CM | POA: Diagnosis not present

## 2019-10-05 DIAGNOSIS — Z91030 Bee allergy status: Secondary | ICD-10-CM | POA: Diagnosis not present

## 2019-10-05 DIAGNOSIS — W231XXA Caught, crushed, jammed, or pinched between stationary objects, initial encounter: Secondary | ICD-10-CM | POA: Insufficient documentation

## 2019-10-05 DIAGNOSIS — E109 Type 1 diabetes mellitus without complications: Secondary | ICD-10-CM | POA: Diagnosis not present

## 2019-10-05 DIAGNOSIS — S67190A Crushing injury of right index finger, initial encounter: Secondary | ICD-10-CM | POA: Diagnosis not present

## 2019-10-05 DIAGNOSIS — F17210 Nicotine dependence, cigarettes, uncomplicated: Secondary | ICD-10-CM | POA: Diagnosis not present

## 2019-10-05 DIAGNOSIS — S6710XA Crushing injury of unspecified finger(s), initial encounter: Secondary | ICD-10-CM

## 2019-10-05 DIAGNOSIS — I10 Essential (primary) hypertension: Secondary | ICD-10-CM | POA: Diagnosis not present

## 2019-10-05 DIAGNOSIS — S6991XA Unspecified injury of right wrist, hand and finger(s), initial encounter: Secondary | ICD-10-CM | POA: Diagnosis present

## 2019-10-05 DIAGNOSIS — Z794 Long term (current) use of insulin: Secondary | ICD-10-CM | POA: Insufficient documentation

## 2019-10-05 DIAGNOSIS — Y939 Activity, unspecified: Secondary | ICD-10-CM | POA: Diagnosis not present

## 2019-10-05 DIAGNOSIS — Y929 Unspecified place or not applicable: Secondary | ICD-10-CM | POA: Diagnosis not present

## 2019-10-05 DIAGNOSIS — Y990 Civilian activity done for income or pay: Secondary | ICD-10-CM | POA: Insufficient documentation

## 2019-10-05 MED ORDER — HYDROCODONE-ACETAMINOPHEN 5-325 MG PO TABS: 1.0000 | ORAL_TABLET | ORAL | 0 refills | Status: DC | PRN

## 2019-10-05 MED ORDER — NAPROXEN 250 MG PO TABS
500.0000 mg | ORAL_TABLET | Freq: Once | ORAL | Status: AC
  Administered 2019-10-05: 500 mg via ORAL
  Filled 2019-10-05: qty 2

## 2019-10-05 MED ORDER — HYDROCODONE-ACETAMINOPHEN 5-325 MG PO TABS
1.0000 | ORAL_TABLET | Freq: Once | ORAL | Status: AC
  Administered 2019-10-05: 1 via ORAL
  Filled 2019-10-05: qty 1

## 2019-10-05 NOTE — ED Triage Notes (Signed)
Pt reports right index finger crushed between wood at work. Swelling to right index finger noted.

## 2019-10-05 NOTE — ED Notes (Signed)
Post accident UDS completed. 
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
## 2019-11-09 ENCOUNTER — Ambulatory Visit (INDEPENDENT_AMBULATORY_CARE_PROVIDER_SITE_OTHER): Payer: PRIVATE HEALTH INSURANCE | Admitting: Family Medicine

## 2019-11-09 ENCOUNTER — Encounter: Payer: Self-pay | Admitting: Family Medicine

## 2019-11-09 ENCOUNTER — Other Ambulatory Visit: Payer: Self-pay

## 2019-11-09 ENCOUNTER — Ambulatory Visit (HOSPITAL_COMMUNITY)
Admission: RE | Admit: 2019-11-09 | Discharge: 2019-11-09 | Disposition: A | Discharge status: Home / Self Care | Payer: 59 | Source: Ambulatory Visit | Attending: Family Medicine | Admitting: Family Medicine

## 2019-11-09 VITALS — BP 144/89 | HR 92 | Temp 97.9°F | Ht 74.0 in | Wt 183.0 lb

## 2019-11-09 DIAGNOSIS — M7989 Other specified soft tissue disorders: Secondary | ICD-10-CM | POA: Insufficient documentation

## 2019-11-09 DIAGNOSIS — L539 Erythematous condition, unspecified: Secondary | ICD-10-CM | POA: Insufficient documentation

## 2019-11-09 DIAGNOSIS — L03116 Cellulitis of left lower limb: Secondary | ICD-10-CM | POA: Diagnosis not present

## 2019-11-09 DIAGNOSIS — I10 Essential (primary) hypertension: Secondary | ICD-10-CM

## 2019-11-09 DIAGNOSIS — Z01118 Encounter for examination of ears and hearing with other abnormal findings: Secondary | ICD-10-CM

## 2019-11-09 MED ORDER — SULFAMETHOXAZOLE-TRIMETHOPRIM 800-160 MG PO TABS: 1.0000 | ORAL_TABLET | Freq: Two times a day (BID) | ORAL | 0 refills | Status: AC

## 2019-11-09 MED ORDER — LISINOPRIL 20 MG PO TABS: 20.0000 mg | ORAL_TABLET | Freq: Every day | ORAL | 2 refills | Status: DC

## 2019-11-09 NOTE — Progress Notes (Signed)
Lmtcb.

## 2019-11-09 NOTE — Progress Notes (Signed)
Assessment & Plan:  1. Cellulitis of left lower leg - sulfamethoxazole-trimethoprim (BACTRIM DS) 800-160 MG tablet; Take 1 tablet by mouth 2 (two) times daily for 7 days.  Dispense: 14 tablet; Refill: 0  2. Abnormal exam of right ear - Patient declines referral to ENT.  Encouraged him to make [place] aware if this area starts enlarging again.  3. Essential hypertension - Uncontrolled.  Lisinopril increased from 10 mg to 20 mg once daily. - lisinopril (ZESTRIL) 20 MG tablet; Take 1 tablet (20 mg total) by mouth daily.  Dispense: 30 tablet; Refill: 2  4-5. Left leg swelling/Erythema of lower extremity - US Venous Img Lower Unilateral Left; Future   Follow up plan: Return next week, for DM.  Hendricks Limes, MSN, APRN, FNP-C [facility]  Subjective:   Patient ID: Andrew Morales, male    DOB: 13-Jan-1991, 29 y.o.   MRN: [id number]  HPI: Andrew Morales is a 30 y.o. male presenting on 11/09/2019 for Leg Swelling (Left leg. Patient states he noticed it last night at work)  Patient made this appointment yesterday to check on his right ear that he reports has a growth in it.  He states he was seen at [facility] where he was given medication for his ear.  This was several months ago per his report.  He states that the time that he went he was unable to hear at all out of that ear but once it started draining his hearing returned.  He now states that he also is concerned about some left leg swelling that started last night around 11:30 PM.  He reports the swelling was not there at 9 PM.  Denies any injury.   ROS: Negative unless specifically indicated above in HPI.   Relevant past medical history reviewed and updated as indicated.   Allergies and medications reviewed and updated.   Current Outpatient Medications:  .  atorvastatin (LIPITOR) 10 MG tablet, Take 1 tablet (10 mg total) by mouth daily., Disp: 30 tablet, Rfl: 2 .  dicyclomine (BENTYL) 10 MG capsule, Take  10 mg by mouth 4 (four) times daily as needed for spasms. , Disp: , Rfl:  .  HYDROcodone-acetaminophen (NORCO) 5-325 MG tablet, Take 1 tablet by mouth every 4 (four) hours as needed for severe pain., Disp: 12 tablet, Rfl: 0 .  lisinopril (ZESTRIL) 20 MG tablet, Take 1 tablet (20 mg total) by mouth daily., Disp: 30 tablet, Rfl: 2 .  metFORMIN (GLUCOPHAGE) 500 MG tablet, Take 1 tablet (500 mg total) by mouth 2 (two) times daily with a meal. (Patient taking differently: Take 500 mg by mouth daily. ), Disp: 60 tablet, Rfl: 2 .  sulfamethoxazole-trimethoprim (BACTRIM DS) 800-160 MG tablet, Take 1 tablet by mouth 2 (two) times daily for 7 days., Disp: 14 tablet, Rfl: 0  Allergies  Allergen Reactions  . Banana Swelling  . Bee Venom Itching  . Strawberry Flavor Rash  . Watermelon Flavor Rash    Objective:   BP (!) 144/89   Pulse 92   Temp 97.9 F (36.6 C) (Temporal)   Ht 6\' 2"  (1.88 m)   Wt 183 lb (83 kg)   SpO2 98%   BMI 23.50 kg/m    Physical Exam Vitals reviewed.  Constitutional:      General: He is not in acute distress.    Appearance: Normal appearance. He is not ill-appearing, toxic-appearing or diaphoretic.  HENT:     Head: Normocephalic and atraumatic.  Right Ear: Tympanic membrane and ear canal normal. No drainage or tenderness. There is no impacted cerumen.     Left Ear: Tympanic membrane, ear canal and external ear normal. There is no impacted cerumen.     Ears:     Comments: Growth just outside of ear canal.  Eyes:     General: No scleral icterus.       Right eye: No discharge.        Left eye: No discharge.     Conjunctiva/sclera: Conjunctivae normal.  Cardiovascular:     Rate and Rhythm: Normal rate.  Pulmonary:     Effort: Pulmonary effort is normal. No respiratory distress.  Musculoskeletal:        General: Normal range of motion.     Cervical back: Normal range of motion.     Left lower leg: 2+ Edema present.  Skin:    General: Skin is warm and dry.      Findings: Erythema (LLE) present.  Neurological:     Mental Status: He is alert and oriented to person, place, and time. Mental status is at baseline.  Psychiatric:        Mood and Affect: Mood normal.        Behavior: Behavior normal.        Thought Content: Thought content normal.        Judgment: Judgment normal.

## 2019-11-22 ENCOUNTER — Other Ambulatory Visit: Payer: Self-pay | Admitting: Family Medicine

## 2019-11-22 DIAGNOSIS — E119 Type 2 diabetes mellitus without complications: Secondary | ICD-10-CM

## 2020-01-16 ENCOUNTER — Telehealth (INDEPENDENT_AMBULATORY_CARE_PROVIDER_SITE_OTHER): Payer: PRIVATE HEALTH INSURANCE | Admitting: Family Medicine

## 2020-01-16 NOTE — Progress Notes (Signed)
Attempted to contact patient via video chat text, did not answer after few minutes, attempted to call the phone number on file and left a voicemail, no answer, call the third number on file for the home phone number and still no answer.

## 2020-10-04 ENCOUNTER — Telehealth: Payer: Self-pay

## 2020-10-04 ENCOUNTER — Telehealth: Payer: Self-pay | Admitting: *Deleted

## 2020-10-04 DIAGNOSIS — I10 Essential (primary) hypertension: Secondary | ICD-10-CM

## 2020-10-04 DIAGNOSIS — E109 Type 1 diabetes mellitus without complications: Secondary | ICD-10-CM

## 2020-10-04 NOTE — Chronic Care Management (AMB) (Signed)
   Care Management   Outreach Note  10/04/2020 Name: Andrew Morales MRN: [id number] DOB: [birth year]  Andrew Morales is a 30 y.o. year old male who is a primary care patient of Gwenlyn Fudge, FNP. I reached out to Andrew Morales by phone today in response to a referral sent by Andrew Morales's health plan.     An unsuccessful telephone outreach was attempted today. The patient was referred to the case management team for assistance with care management and care coordination.   Follow Up Plan: A HIPAA compliant phone message was left for the patient providing contact information and requesting a return call.  The care management team will reach out to the patient again over the next 7 days.  If patient returns call to provider office, please advise to call Embedded Care Management Care Guide Andrew Morales* at [phone].Misty Stanley Delaney Schnick  Care Guide, Embedded Care Coordination [facility] Management

## 2020-10-04 NOTE — Telephone Encounter (Signed)
Transition Care Management Unsuccessful Follow-up Telephone Call  Date of discharge and from where:  10/03/20 from [facility]  Attempts:  1st Attempt  Reason for unsuccessful TCM follow-up call:  No answer/busy

## 2020-10-05 NOTE — Telephone Encounter (Signed)
Transition Care Management Unsuccessful Follow-up Telephone Call  Date of discharge and from where:  10/03/20 from [facility]  Attempts:  2nd Attempt  Reason for unsuccessful TCM follow-up call:  No answer/busy

## 2020-10-08 NOTE — Telephone Encounter (Signed)
Transition Care Management Unsuccessful Follow-up Telephone Call  Date of discharge and from where:  10/03/20 from [facility]  Attempts:  3rd Attempt  Reason for unsuccessful TCM follow-up call:  Unable to reach patient

## 2020-10-09 NOTE — Chronic Care Management (AMB) (Signed)
  Chronic Care Management   Outreach Note  10/09/2020 Name: WILLOW RECZEK MRN: [id number] DOB: 1991/02/04  Marcelline Deist is a 30 y.o. year old male who is a primary care patient of Gwenlyn Fudge, FNP. I reached out to Marcelline Deist by phone today in response to a referral sent by Mr. Ladell Heads Hinderman's health plan.     A second unsuccessful telephone outreach was attempted today. The patient was referred to the case management team for assistance with care management and care coordination.   Follow Up Plan: The care management team will reach out to the patient again over the next 7 days. If patient returns call to provider office, please advise to call Embedded Care Management Care Guide Gwenevere Ghazi at [phone].  Gwenevere Ghazi  Care Guide, Embedded Care Coordination [facility] Management

## 2020-10-17 NOTE — Chronic Care Management (AMB) (Signed)
  Chronic Care Management   Outreach Note  10/17/2020 Name: RAFAEL SALWAY MRN: [id number] DOB: [birth year]  Marcelline Deist is a 30 y.o. year old male who is a primary care patient of Gwenlyn Fudge, FNP. I reached out to Marcelline Deist by phone today in response to a referral sent by Mr. Ladell Heads Schoene's health plan.     Third unsuccessful telephone outreach was attempted today. The patient was referred to the case management team for assistance with care management and care coordination. The patient's primary care provider has been notified of our unsuccessful attempts to make or maintain contact with the patient. The care management team is pleased to engage with this patient at any time in the future should he/she be interested in assistance from the care management team.   Gwenevere Ghazi  Care Guide, Embedded Care Coordination [facility]  Care Management  Direct Dial: [phone]

## 2020-11-08 ENCOUNTER — Telehealth: Payer: Self-pay

## 2020-11-08 NOTE — Telephone Encounter (Signed)
FYI: Patient has been called multiple times to schedule a diabetic check up with provider since patient is overdue per provider.  Unable to reach patient and unable to leave voicemail.

## 2021-03-16 DIAGNOSIS — Z79899 Other long term (current) drug therapy: Secondary | ICD-10-CM | POA: Diagnosis not present

## 2021-03-16 DIAGNOSIS — Z91018 Allergy to other foods: Secondary | ICD-10-CM | POA: Diagnosis not present

## 2021-03-16 DIAGNOSIS — M25511 Pain in right shoulder: Secondary | ICD-10-CM | POA: Diagnosis not present

## 2021-03-16 DIAGNOSIS — S46011A Strain of muscle(s) and tendon(s) of the rotator cuff of right shoulder, initial encounter: Secondary | ICD-10-CM | POA: Diagnosis not present

## 2021-03-16 DIAGNOSIS — Z7984 Long term (current) use of oral hypoglycemic drugs: Secondary | ICD-10-CM | POA: Diagnosis not present

## 2021-03-16 DIAGNOSIS — S46911A Strain of unspecified muscle, fascia and tendon at shoulder and upper arm level, right arm, initial encounter: Secondary | ICD-10-CM | POA: Diagnosis not present

## 2021-03-16 DIAGNOSIS — Y990 Civilian activity done for income or pay: Secondary | ICD-10-CM | POA: Diagnosis not present

## 2021-03-16 DIAGNOSIS — X500XXA Overexertion from strenuous movement or load, initial encounter: Secondary | ICD-10-CM | POA: Diagnosis not present

## 2021-03-16 DIAGNOSIS — E119 Type 2 diabetes mellitus without complications: Secondary | ICD-10-CM | POA: Diagnosis not present

## 2021-03-16 DIAGNOSIS — I10 Essential (primary) hypertension: Secondary | ICD-10-CM | POA: Diagnosis not present

## 2021-03-22 DIAGNOSIS — M25511 Pain in right shoulder: Secondary | ICD-10-CM | POA: Diagnosis not present
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
## 2021-06-16 IMAGING — US US EXTREM LOW VENOUS*L*
1 series · 14 of 24 positions shown · non-contrast
Comparison: None.

CLINICAL DATA: Left leg edema and erythema

EXAM:
Left LOWER EXTREMITY VENOUS DOPPLER ULTRASOUND
TECHNIQUE: Gray-scale sonography with compression, as well as color and duplex
ultrasound, were performed to evaluate the deep venous system(s)
from the level of the common femoral vein through the popliteal and
proximal calf veins.

[Series 1: us extrem low venous*left* · 0.06mm/px · 14 of 53 slices shown]
[im 1/53]
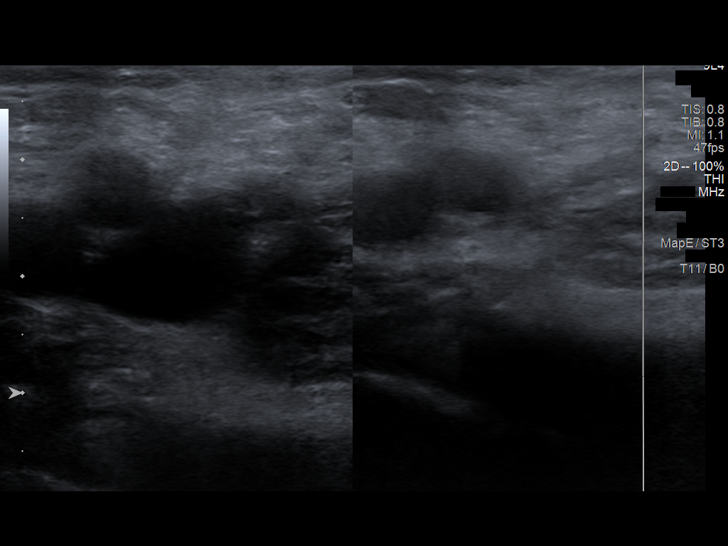
[im 5/53]
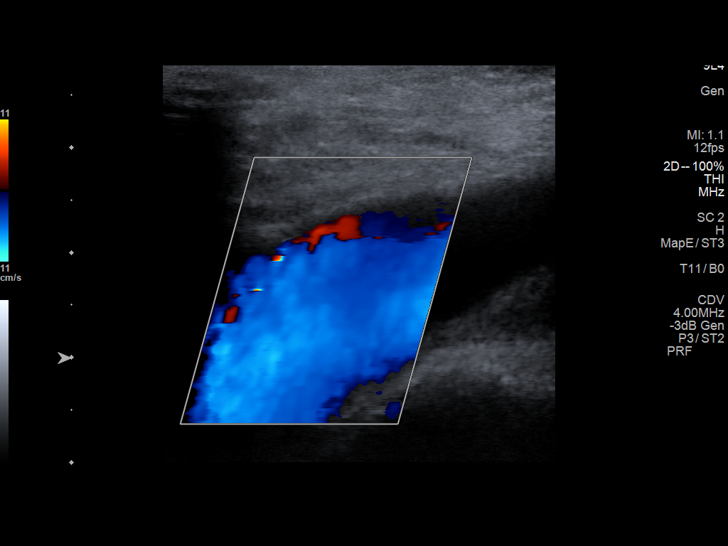
[im 10/53]
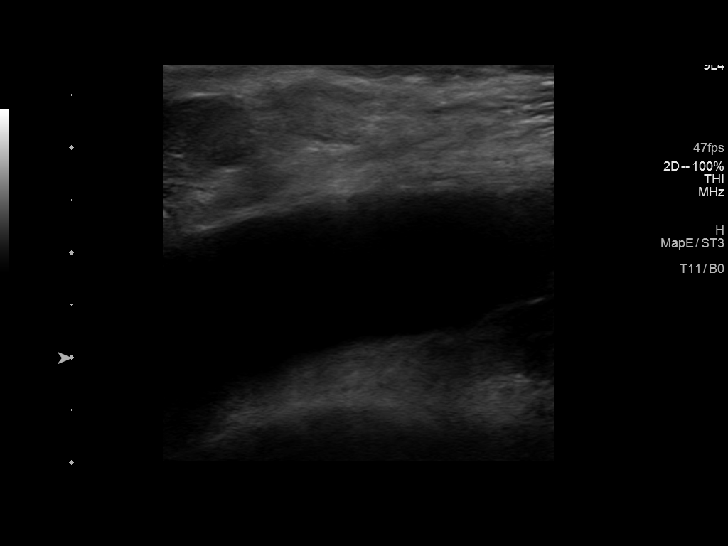
[im 14/53]
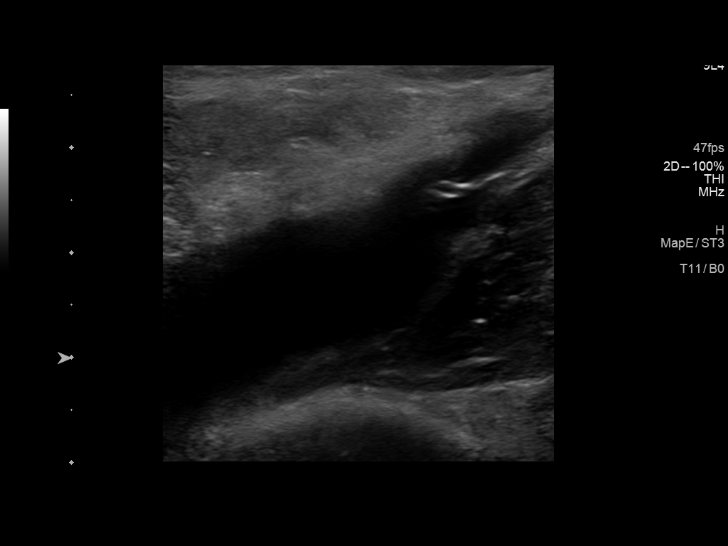
[im 16/53]
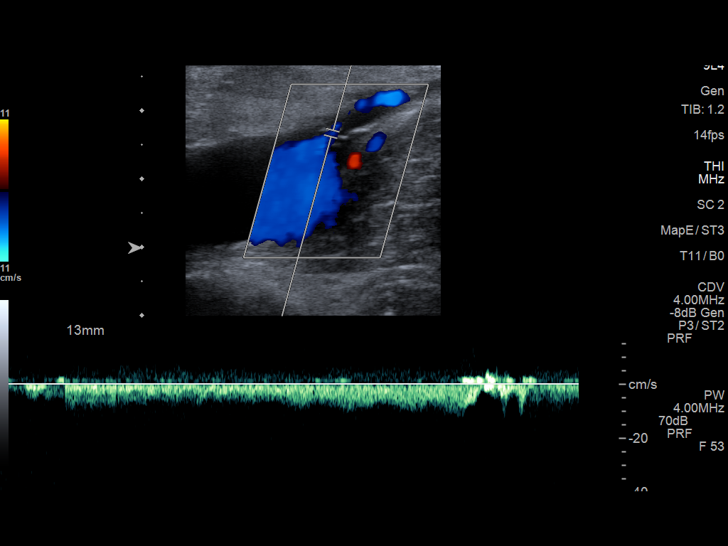
[im 21/53]
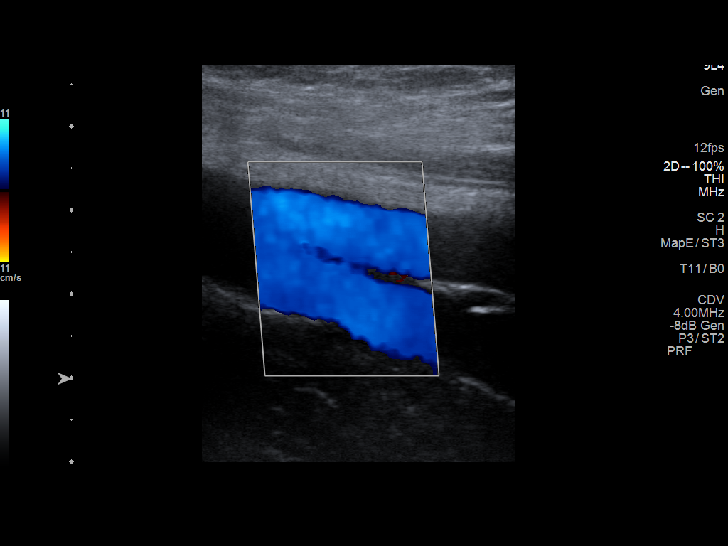
[im 25/53]
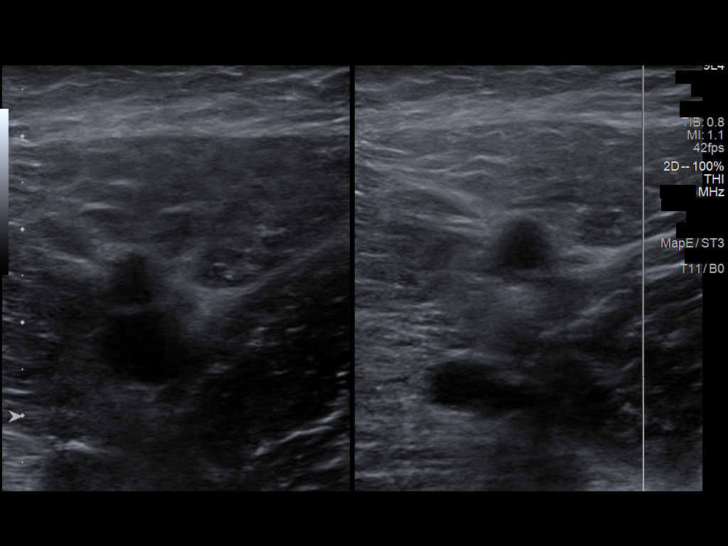
[im 28/53]
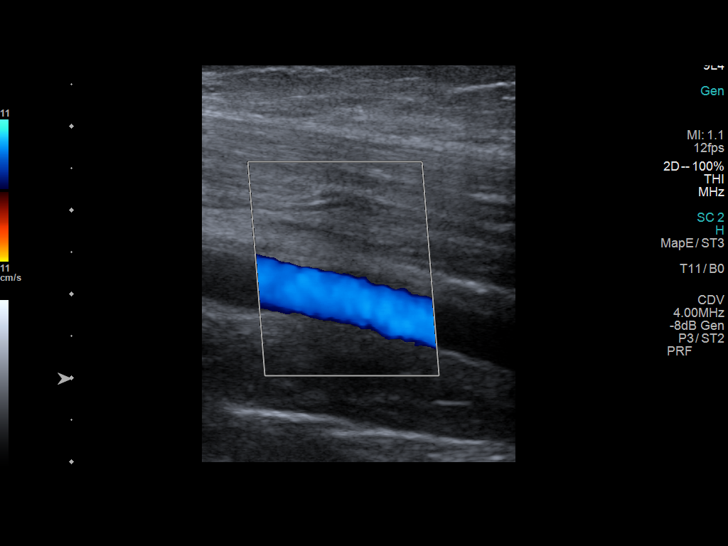
[im 32/53]
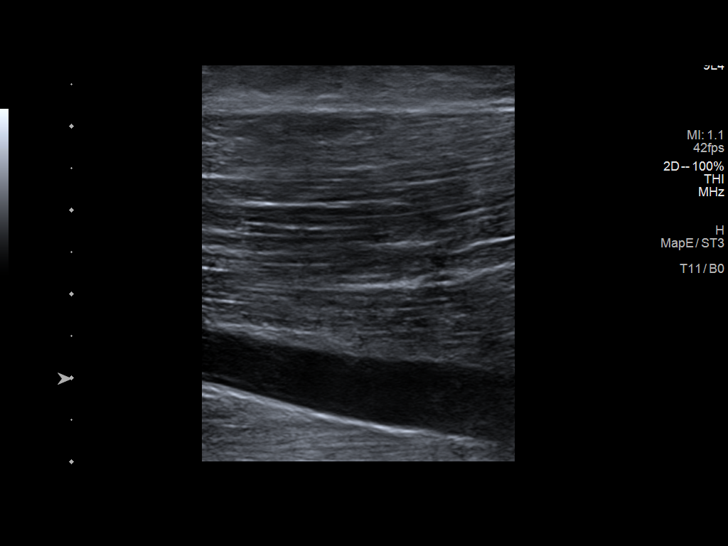
[im 37/53]
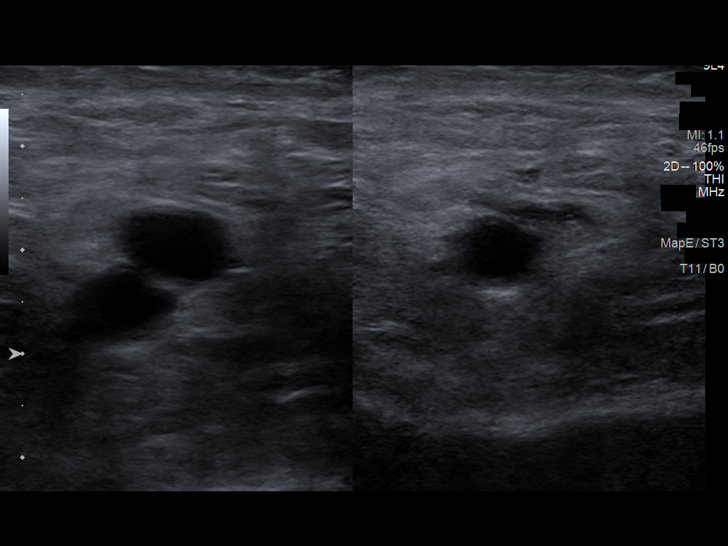
[im 41/53]
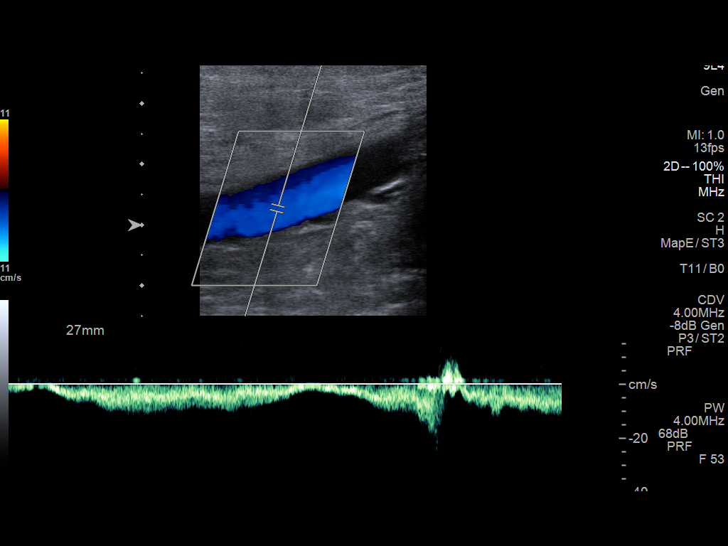
[im 43/53]
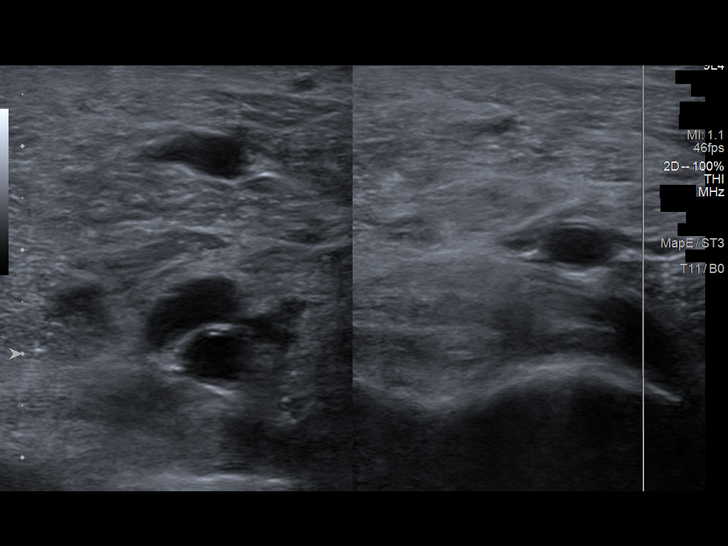
[im 48/53]
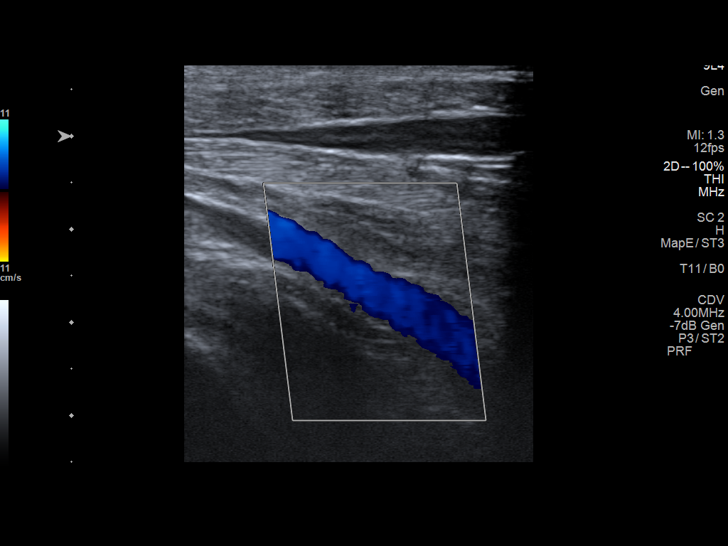
[im 53/53]
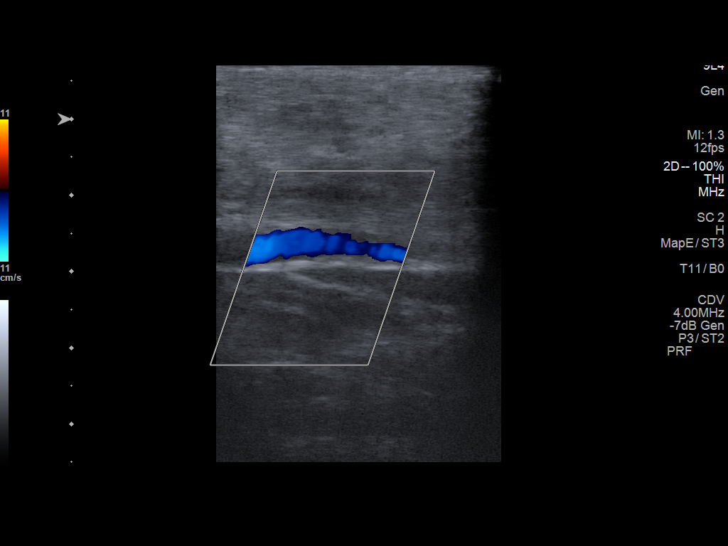

[14 of 24 positions shown; findings below may reference images not displayed]

FINDINGS: VENOUS

Normal compressibility of the common femoral, superficial femoral,
and popliteal veins, as well as the visualized calf veins.
Visualized portions of profunda femoral vein and great saphenous
vein unremarkable. No filling defects to suggest DVT on grayscale or
color Doppler imaging. Doppler waveforms show normal direction of
venous flow, normal respiratory phasicity and response to
augmentation.

Limited views of the contralateral common femoral vein are
unremarkable.

OTHER

None.

Limitations: none
IMPRESSION: No femoropopliteal DVT nor evidence of DVT within the visualized
calf veins.

## 2021-09-12 ENCOUNTER — Encounter (HOSPITAL_COMMUNITY): Payer: Self-pay

## 2021-09-12 ENCOUNTER — Emergency Department (HOSPITAL_COMMUNITY)
Admission: EM | Admit: 2021-09-12 | Discharge: 2021-09-12 | Disposition: A | Discharge status: Home / Self Care | Payer: 59 | Attending: Emergency Medicine | Admitting: Emergency Medicine

## 2021-09-12 DIAGNOSIS — Z7984 Long term (current) use of oral hypoglycemic drugs: Secondary | ICD-10-CM | POA: Insufficient documentation

## 2021-09-12 DIAGNOSIS — K029 Dental caries, unspecified: Secondary | ICD-10-CM | POA: Insufficient documentation

## 2021-09-12 DIAGNOSIS — E109 Type 1 diabetes mellitus without complications: Secondary | ICD-10-CM | POA: Insufficient documentation

## 2021-09-12 DIAGNOSIS — I10 Essential (primary) hypertension: Secondary | ICD-10-CM | POA: Insufficient documentation

## 2021-09-12 DIAGNOSIS — K0889 Other specified disorders of teeth and supporting structures: Secondary | ICD-10-CM

## 2021-09-12 DIAGNOSIS — Z79899 Other long term (current) drug therapy: Secondary | ICD-10-CM | POA: Insufficient documentation

## 2021-09-12 MED ORDER — PENICILLIN V POTASSIUM 500 MG PO TABS: 500.0000 mg | ORAL_TABLET | Freq: Four times a day (QID) | ORAL | 0 refills | Status: AC

## 2021-09-12 MED ORDER — OXYCODONE HCL 5 MG PO TABS: 5.0000 mg | ORAL_TABLET | ORAL | 0 refills | Status: AC | PRN

## 2021-09-12 MED ORDER — OXYCODONE-ACETAMINOPHEN 5-325 MG PO TABS: 1.0000 | ORAL_TABLET | Freq: Four times a day (QID) | ORAL | 0 refills | Status: DC | PRN

## 2021-09-12 NOTE — ED Provider Notes (Signed)
[facility] EMERGENCY DEPARTMENT Provider Note   CSN: [id number] Arrival date & time: 09/12/21  1248     History  No chief complaint on file.   Jill Ruppe. is a 31 y.o. male. HPI  Medical history includes hypertension, hypercholesterolemia, diabetes type 1. Presents to the emergency department after breaking a tooth 2 days ago while he was eating an apple.  It is his left inferior second molar.  He is experiencing pretty bad pain that is worse with cold.  He is supposed to be seeing a dentist but cannot get an appointment for 2 weeks.  They referred him to the emergency department.  He denies any fevers, chills.     Home Medications Prior to Admission medications   Medication Sig Start Date End Date Taking? Authorizing Provider  oxyCODONE (ROXICODONE) 5 MG immediate release tablet Take 1 tablet (5 mg total) by mouth every 4 (four) hours as needed for up to 5 days for severe pain. 09/12/21 09/17/21 Yes Ranny Wiebelhaus, Finis Bud, PA-C  penicillin v potassium (VEETID) 500 MG tablet Take 1 tablet (500 mg total) by mouth 4 (four) times daily for 7 days. 09/12/21 09/19/21 Yes Gareld Obrecht, Finis Bud, PA-C  atorvastatin (LIPITOR) 10 MG tablet Take 1 tablet (10 mg total) by mouth daily. 08/17/19   Gwenlyn Fudge, FNP  dicyclomine (BENTYL) 10 MG capsule Take 10 mg by mouth 4 (four) times daily as needed for spasms.  08/28/19   [provider]  HYDROcodone-acetaminophen (NORCO) 5-325 MG tablet Take 1 tablet by mouth every 4 (four) hours as needed for severe pain. 10/05/19   Molpus, John, MD  lisinopril (ZESTRIL) 20 MG tablet Take 1 tablet (20 mg total) by mouth daily. 11/09/19   Gwenlyn Fudge, FNP  metFORMIN (GLUCOPHAGE) 500 MG tablet Take 1 tablet (500 mg total) by mouth 2 (two) times daily with a meal. (Needs to be seen before next refill) 11/22/19   Gwenlyn Fudge, FNP      Allergies    Banana, Bee venom, Strawberry flavor, and Watermelon flavor    Review of Systems   Review of Systems   HENT:  Positive for dental problem.   All other systems reviewed and are negative.  Physical Exam Updated Vital Signs BP (!) 155/92 (BP Location: Right Arm)    Pulse 88    Temp 98.4 F (36.9 C)    Resp 16    Ht 6\' 2"  (1.88 m)    Wt 88.5 kg    SpO2 98%    BMI 25.04 kg/m  Physical Exam Vitals and nursing note reviewed.  Constitutional:      General: He is not in acute distress.    Appearance: Normal appearance. He is well-developed. He is not ill-appearing, toxic-appearing or diaphoretic.  HENT:     Head: Normocephalic and atraumatic.     Comments: No facial swelling    Nose: No nasal deformity.     Mouth/Throat:     Lips: Pink. No lesions.     Dentition: Abnormal dentition. Dental tenderness and dental caries present.      Comments: There is a cracked tooth on the left inferior second molar.  Location is marked on the above image.  It looks like it goes through the enamel and into the dentin.  I do not see that it extends into the pulp. I could not feel Any abscess in the surrounding area Eyes:     General: Gaze aligned appropriately. No scleral icterus.  Right eye: No discharge.        Left eye: No discharge.     Conjunctiva/sclera: Conjunctivae normal.     Right eye: Right conjunctiva is not injected. No exudate or hemorrhage.    Left eye: Left conjunctiva is not injected. No exudate or hemorrhage. Pulmonary:     Effort: Pulmonary effort is normal. No respiratory distress.  Skin:    General: Skin is warm and dry.  Neurological:     Mental Status: He is alert and oriented to person, place, and time.  Psychiatric:        Mood and Affect: Mood normal.        Speech: Speech normal.        Behavior: Behavior normal. Behavior is cooperative.    ED Results / Procedures / Treatments   Labs (all labs ordered are listed, but only abnormal results are displayed) Labs Reviewed - No data to display  EKG None  Radiology No results found.  Procedures Procedures    Medications Ordered in ED Medications - No data to display  ED Course/ Medical Decision Making/ A&P Clinical Course as of 09/12/21 2312  Thu Sep 12, 2021  1418 Spoke with Dr. Lucky Cowboy who will see patient on Monday in office.  [GL]    Clinical Course User Index [GL] Victorino Dike Finis Bud, PA-C                           Medical Decision Making Amount and/or Complexity of Data Reviewed Discussion of management or test interpretation with external provider(s): Dr. Lucky Cowboy from Dental  Risk Prescription drug management. Parenteral controlled substances.   This is a 31 y.o. male with a PMH of  hypertension, hypercholesterolemia, diabetes type 1 who presents to the ED with a left inferior second molar dental fracture.  I cannot visualize pulp but fracture does extend into dentin. Suspect this is an Chief of Staff II fracture.   Consulting Dental for follow up.  I spoke With Dr. Lucky Cowboy and patient has an appointment next Monday in the office for tooth extraction.  Pain medication and antibiotic prescription provided prior to discharge.  I have seen and evaluated this patient in conjunction with my attending physician who agrees and has made changes to the plan accordingly.  Portions of this note were generated with Scientist, clinical (histocompatibility and immunogenetics). Dictation errors may occur despite best attempts at proofreading.   Final Clinical Impression(s) / ED Diagnoses Final diagnoses:  Pain, dental    Rx / DC Orders ED Discharge Orders          Ordered    penicillin v potassium (VEETID) 500 MG tablet  4 times daily        09/12/21 1409    oxyCODONE-acetaminophen (PERCOCET/ROXICET) 5-325 MG tablet  Every 6 hours PRN,   Status:  Discontinued        09/12/21 1446    oxyCODONE (ROXICODONE) 5 MG immediate release tablet  Every 4 hours PRN        09/12/21 1542              Asael Pann, Finis Bud, PA-C 09/12/21 2312    Terrilee Files, MD 09/13/21 1226

## 2021-09-12 NOTE — ED Triage Notes (Signed)
Broke a tooth off 2 days ago hurts so bad he called out of work.  Left cap off per pt.

## 2021-09-12 NOTE — Discharge Instructions (Addendum)
You have an appointment scheduled with Dr. Geralynn Ochs on 09/16/2021 at 3 pm. Please call them if you need to reschedule. They have asked that you go to [email or web address], login through the patient portal and fill out the new patient paperwork requested.  I have prescribed you an antibiotic to take in the meanwhile. I have also provided you with a short course of pain medication. Please do not drive or operate heavy machinery while taking this.

## 2022-03-14 ENCOUNTER — Other Ambulatory Visit: Payer: Self-pay

## 2022-03-14 ENCOUNTER — Emergency Department (HOSPITAL_COMMUNITY)
Admission: EM | Admit: 2022-03-14 | Discharge: 2022-03-14 | Discharge status: Against Medical Advice | Payer: Self-pay | Source: Home / Self Care

## 2022-10-08 ENCOUNTER — Other Ambulatory Visit: Payer: Self-pay

## 2022-10-08 MED ORDER — METRONIDAZOLE 500 MG PO TABS: 500.0000 mg | ORAL_TABLET | Freq: Two times a day (BID) | ORAL | 0 refills | Status: DC

## 2022-10-09 DIAGNOSIS — Z419 Encounter for procedure for purposes other than remedying health state, unspecified: Secondary | ICD-10-CM | POA: Diagnosis not present

## 2022-10-19 DIAGNOSIS — F17200 Nicotine dependence, unspecified, uncomplicated: Secondary | ICD-10-CM | POA: Diagnosis not present

## 2022-10-19 DIAGNOSIS — S99921A Unspecified injury of right foot, initial encounter: Secondary | ICD-10-CM | POA: Diagnosis not present

## 2022-10-19 DIAGNOSIS — S90121A Contusion of right lesser toe(s) without damage to nail, initial encounter: Secondary | ICD-10-CM | POA: Diagnosis not present

## 2022-10-19 DIAGNOSIS — S9031XA Contusion of right foot, initial encounter: Secondary | ICD-10-CM | POA: Diagnosis not present

## 2022-10-19 DIAGNOSIS — M79671 Pain in right foot: Secondary | ICD-10-CM | POA: Diagnosis not present

## 2022-10-19 DIAGNOSIS — W208XXA Other cause of strike by thrown, projected or falling object, initial encounter: Secondary | ICD-10-CM | POA: Diagnosis not present

## 2022-10-19 DIAGNOSIS — W228XXA Striking against or struck by other objects, initial encounter: Secondary | ICD-10-CM | POA: Diagnosis not present

## 2022-10-20 ENCOUNTER — Telehealth: Payer: Self-pay

## 2022-10-20 NOTE — Transitions of Care (Post Inpatient/ED Visit) (Unsigned)
   10/20/2022  Name: Andrew Morales. MRN: [id number] DOB: September 21, 1990  Today's TOC FU Call Status: Today's TOC FU Call Status:: Unsuccessul Call (1st Attempt) Unsuccessful Call (1st Attempt) Date: 10/20/22  Attempted to reach the patient regarding the most recent Inpatient/ED visit.  Follow Up Plan: Additional outreach attempts will be made to reach the patient to complete the Transitions of Care (Post Inpatient/ED visit) call.   Signature Juanda Crumble, [place] Direct Dial [phone]

## 2022-10-21 NOTE — Transitions of Care (Post Inpatient/ED Visit) (Unsigned)
   10/21/2022  Name: Andrew Morales. MRN: [id number] DOB: Jan 06, 1991  Today's TOC FU Call Status: Today's TOC FU Call Status:: Unsuccessul Call (1st Attempt) Unsuccessful Call (1st Attempt) Date: 10/20/22  Attempted to reach the patient regarding the most recent Inpatient/ED visit.  Follow Up Plan: No further outreach attempts will be made at this time. We have been unable to contact the patient.  Signature Juanda Crumble, [place] Direct Dial [phone]

## 2022-10-22 NOTE — Transitions of Care (Post Inpatient/ED Visit) (Signed)
   10/22/2022  Name: Donya Tomaro. MRN: [id number] DOB: 01-20-1991  Today's TOC FU Call Status: Today's TOC FU Call Status:: Unsuccessful Call (3rd Attempt) Unsuccessful Call (1st Attempt) Date: 10/20/22 Unsuccessful Call (3rd Attempt) Date: 10/22/22  Attempted to reach the patient regarding the most recent Inpatient/ED visit.  Follow Up Plan: No further outreach attempts will be made at this time. We have been unable to contact the patient.  Signature Juanda Crumble, [place] Direct Dial [phone]

## 2022-10-31 DIAGNOSIS — L853 Xerosis cutis: Secondary | ICD-10-CM | POA: Diagnosis not present

## 2022-10-31 DIAGNOSIS — M79671 Pain in right foot: Secondary | ICD-10-CM | POA: Diagnosis not present

## 2022-10-31 DIAGNOSIS — M2041 Other hammer toe(s) (acquired), right foot: Secondary | ICD-10-CM | POA: Diagnosis not present

## 2022-10-31 DIAGNOSIS — Z6822 Body mass index (BMI) 22.0-22.9, adult: Secondary | ICD-10-CM | POA: Diagnosis not present

## 2022-10-31 DIAGNOSIS — L309 Dermatitis, unspecified: Secondary | ICD-10-CM | POA: Diagnosis not present

## 2022-10-31 DIAGNOSIS — Z0130 Encounter for examination of blood pressure without abnormal findings: Secondary | ICD-10-CM | POA: Diagnosis not present

## 2022-11-07 DIAGNOSIS — Z419 Encounter for procedure for purposes other than remedying health state, unspecified: Secondary | ICD-10-CM | POA: Diagnosis not present

## 2022-12-08 DIAGNOSIS — Z419 Encounter for procedure for purposes other than remedying health state, unspecified: Secondary | ICD-10-CM | POA: Diagnosis not present

## 2023-01-07 DIAGNOSIS — Z419 Encounter for procedure for purposes other than remedying health state, unspecified: Secondary | ICD-10-CM | POA: Diagnosis not present

## 2023-01-19 ENCOUNTER — Ambulatory Visit: Payer: Medicaid Other | Admitting: Family Medicine

## 2023-01-21 LAB — HM DIABETES EYE EXAM

## 2023-02-07 DIAGNOSIS — Z419 Encounter for procedure for purposes other than remedying health state, unspecified: Secondary | ICD-10-CM | POA: Diagnosis not present

## 2023-02-16 ENCOUNTER — Encounter: Payer: Self-pay | Admitting: Family Medicine

## 2023-02-16 ENCOUNTER — Telehealth: Payer: Self-pay | Admitting: Family Medicine

## 2023-02-16 ENCOUNTER — Ambulatory Visit (INDEPENDENT_AMBULATORY_CARE_PROVIDER_SITE_OTHER): Payer: Medicaid Other | Admitting: Family Medicine

## 2023-02-16 VITALS — BP 158/88 | HR 85 | Ht 74.0 in | Wt 181.0 lb

## 2023-02-16 DIAGNOSIS — E109 Type 1 diabetes mellitus without complications: Secondary | ICD-10-CM | POA: Diagnosis not present

## 2023-02-16 DIAGNOSIS — R2991 Unspecified symptoms and signs involving the musculoskeletal system: Secondary | ICD-10-CM | POA: Diagnosis not present

## 2023-02-16 DIAGNOSIS — E0789 Other specified disorders of thyroid: Secondary | ICD-10-CM | POA: Diagnosis not present

## 2023-02-16 DIAGNOSIS — F321 Major depressive disorder, single episode, moderate: Secondary | ICD-10-CM | POA: Diagnosis not present

## 2023-02-16 DIAGNOSIS — E559 Vitamin D deficiency, unspecified: Secondary | ICD-10-CM | POA: Diagnosis not present

## 2023-02-16 DIAGNOSIS — E7849 Other hyperlipidemia: Secondary | ICD-10-CM

## 2023-02-16 DIAGNOSIS — I10 Essential (primary) hypertension: Secondary | ICD-10-CM | POA: Diagnosis not present

## 2023-02-16 DIAGNOSIS — Z1159 Encounter for screening for other viral diseases: Secondary | ICD-10-CM

## 2023-02-16 DIAGNOSIS — E7800 Pure hypercholesterolemia, unspecified: Secondary | ICD-10-CM | POA: Diagnosis not present

## 2023-02-16 DIAGNOSIS — R7301 Impaired fasting glucose: Secondary | ICD-10-CM

## 2023-02-16 DIAGNOSIS — Z720 Tobacco use: Secondary | ICD-10-CM

## 2023-02-16 MED ORDER — LISINOPRIL 20 MG PO TABS: 20.0000 mg | ORAL_TABLET | Freq: Every day | ORAL | 2 refills | Status: DC

## 2023-02-16 NOTE — Assessment & Plan Note (Signed)
Diagnosed at age 32 He has not been on insulin therapy for over 2 years He was switched to metformin 500 mg daily and has not been compliant with treatment regimen due to loss of his insurance Denies polyuria, polyphagia, polydipsia Will assess his blood sugar today in the clinic Will verify if his diagnosis is truly type 1 diabetes with C-peptide and insulin also antibiotics serum  Pending hemoglobin A1c Lab Results  Component Value Date   HGBA1C 8.1 (H) 08/16/2019

## 2023-02-16 NOTE — Assessment & Plan Note (Addendum)
PHQ-9 is 11 GAD-7 is 7 Denies suicidal thoughts and ideation Reports increased life stresses Declines pharmacological intervention at this time Would like to speak with a therapist The patient's partner is the primary historian Referral placed to integrated behavioral health for talk therapy  Patient and/or legal guardian verbally consented to [facility] services about presenting concerns and psychiatric consultation as appropriate.  The services will be billed as appropriate for the patient

## 2023-02-16 NOTE — Assessment & Plan Note (Signed)
Smokes about 2 pack/every 2day  Asked about quitting: confirms that hecurrently smokes cigarettes Advise to quit smoking: Educated about QUITTING to reduce the risk of cancer, cardio and cerebrovascular disease. Assess willingness: Unwilling to quit at this time, but is working on cutting back. Assist with counseling and pharmacotherapy: Counseled for 5 minutes and literature provided. Arrange for follow up: follow up in 3 months and continue to offer help.
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
## 2023-02-16 NOTE — Assessment & Plan Note (Signed)
Referral placed to podiatry for abnormal diabetic foot exam finding and treatment of plantar wart on the left foot Complains of pain with prolonged standing at the affected site We will consider referral to dermatology

## 2023-02-16 NOTE — Assessment & Plan Note (Signed)
Uncontrolled Reported he has not been taking his lisinopril 20 mg daily for about a year due to loss of his insurance asymptomatic today in the clinic Refill sent to pharmacy Encouraged low-sodium diet with increased physical activity Will reassess BP in 4 weeks

## 2023-02-16 NOTE — Patient Instructions (Addendum)
I appreciate the opportunity to provide care to you today!    Follow up:  1 months  Labs: please stop by the lab during the week to get your blood drawn (CBC, CMP, TSH, Lipid profile, HgA1c, Vit D)  Screening: HIV and Hep C, C-peptite, GAD-65 and IA-2  High Blood Pressure Please start taking lisinopril 20 mg daily, the refill is sent to your pharmacy to start therapy I recommend low-sodium diet with increased risk activity I will follow-up with you in a month to assess your blood pressure   Referrals today- Podiatry, integrated behavioral health for talk therapy   Please continue to a heart-healthy diet and increase your physical activities. Try to exercise for at least five days a week.      It was a pleasure to see you and I look forward to continuing to work together on your health and well-being. Please do not hesitate to call the office if you need care or have questions about your care.   Have a wonderful day and week. With Gratitude, [place] Laroche MSN, FNP-BC

## 2023-02-17 DIAGNOSIS — E7849 Other hyperlipidemia: Secondary | ICD-10-CM | POA: Diagnosis not present

## 2023-02-17 DIAGNOSIS — E0789 Other specified disorders of thyroid: Secondary | ICD-10-CM | POA: Diagnosis not present

## 2023-02-17 DIAGNOSIS — Z1159 Encounter for screening for other viral diseases: Secondary | ICD-10-CM | POA: Diagnosis not present

## 2023-02-17 DIAGNOSIS — E559 Vitamin D deficiency, unspecified: Secondary | ICD-10-CM | POA: Diagnosis not present

## 2023-02-17 DIAGNOSIS — E109 Type 1 diabetes mellitus without complications: Secondary | ICD-10-CM | POA: Diagnosis not present

## 2023-02-17 DIAGNOSIS — R7301 Impaired fasting glucose: Secondary | ICD-10-CM | POA: Diagnosis not present

## 2023-02-17 NOTE — Telephone Encounter (Signed)
Blood glucose was not checked at visit.

## 2023-02-18 ENCOUNTER — Other Ambulatory Visit: Payer: Self-pay | Admitting: Family Medicine

## 2023-02-18 DIAGNOSIS — E559 Vitamin D deficiency, unspecified: Secondary | ICD-10-CM

## 2023-02-18 LAB — IA-2 AUTOANTIBODIES

## 2023-02-18 MED ORDER — VITAMIN D (ERGOCALCIFEROL) 1.25 MG (50000 UNIT) PO CAPS
50000.0000 [IU] | ORAL_CAPSULE | ORAL | 1 refills | Status: DC
Start: 2023-02-18 — End: 2023-07-21

## 2023-02-19 ENCOUNTER — Other Ambulatory Visit: Payer: Self-pay | Admitting: Family Medicine

## 2023-02-19 DIAGNOSIS — E119 Type 2 diabetes mellitus without complications: Secondary | ICD-10-CM

## 2023-02-19 LAB — CBC WITH DIFFERENTIAL/PLATELET
Basophils Absolute: 0 10*3/uL (ref 0.0–0.2)
Basos: 0 %
EOS (ABSOLUTE): 0.3 10*3/uL (ref 0.0–0.4)
Eos: 7 %
Hematocrit: 29 % — ABNORMAL LOW (ref 37.5–51.0)
Hemoglobin: 9.9 g/dL — ABNORMAL LOW (ref 13.0–17.7)
Immature Grans (Abs): 0 10*3/uL (ref 0.0–0.1)
Immature Granulocytes: 0 %
Lymphocytes Absolute: 1.4 10*3/uL (ref 0.7–3.1)
Lymphs: 29 %
MCH: 31 pg (ref 26.6–33.0)
MCHC: 34.1 g/dL (ref 31.5–35.7)
MCV: 91 fL (ref 79–97)
Monocytes Absolute: 0.2 10*3/uL (ref 0.1–0.9)
Monocytes: 5 %
Neutrophils Absolute: 2.8 10*3/uL (ref 1.4–7.0)
Neutrophils: 59 %
Platelets: 216 10*3/uL (ref 150–450)
RBC: 3.19 x10E6/uL — ABNORMAL LOW (ref 4.14–5.80)
RDW: 12.6 % (ref 11.6–15.4)
WBC: 4.7 10*3/uL (ref 3.4–10.8)

## 2023-02-19 LAB — HEPATITIS C ANTIBODY: Hep C Virus Ab: NONREACTIVE

## 2023-02-19 LAB — LIPID PANEL
Chol/HDL Ratio: 3.8 ratio (ref 0.0–5.0)
Cholesterol, Total: 156 mg/dL (ref 100–199)
HDL: 41 mg/dL (ref >39)
LDL Chol Calc (NIH): 94 mg/dL (ref 0–99)
Triglycerides: 116 mg/dL (ref 0–149)
VLDL Cholesterol Cal: 21 mg/dL (ref 5–40)

## 2023-02-19 LAB — CMP14+EGFR
ALT: 13 IU/L (ref 0–44)
AST: 21 IU/L (ref 0–40)
Albumin/Globulin Ratio: 1.6
Albumin: 4.2 g/dL (ref 4.1–5.1)
Alkaline Phosphatase: 150 IU/L — ABNORMAL HIGH (ref 44–121)
BUN/Creatinine Ratio: 8 — ABNORMAL LOW (ref 9–20)
BUN: 14 mg/dL (ref 6–20)
Bilirubin Total: <0.2 mg/dL (ref 0.0–1.2)
CO2: 21 mmol/L (ref 20–29)
Calcium: 9 mg/dL (ref 8.7–10.2)
Chloride: 106 mmol/L (ref 96–106)
Creatinine, Ser: 1.73 mg/dL — ABNORMAL HIGH (ref 0.76–1.27)
Globulin, Total: 2.6 g/dL (ref 1.5–4.5)
Glucose: 121 mg/dL — ABNORMAL HIGH (ref 70–99)
Potassium: 4.6 mmol/L (ref 3.5–5.2)
Sodium: 139 mmol/L (ref 134–144)
Total Protein: 6.8 g/dL (ref 6.0–8.5)
eGFR: 53 mL/min/{1.73_m2} — ABNORMAL LOW (ref >59)

## 2023-02-19 LAB — MICROALBUMIN / CREATININE URINE RATIO
Creatinine, Urine: 67.4 mg/dL
Microalb/Creat Ratio: 694 mg/g creat — ABNORMAL HIGH (ref 0–29)
Microalbumin, Urine: 467.8 ug/mL

## 2023-02-19 LAB — TSH+FREE T4
Free T4: 1.1 ng/dL (ref 0.82–1.77)
TSH: 2.08 u[IU]/mL (ref 0.450–4.500)

## 2023-02-19 LAB — HEMOGLOBIN A1C
Est. average glucose Bld gHb Est-mCnc: 131 mg/dL
Hgb A1c MFr Bld: 6.2 % — ABNORMAL HIGH (ref 4.8–5.6)

## 2023-02-19 LAB — VITAMIN D 25 HYDROXY (VIT D DEFICIENCY, FRACTURES): Vit D, 25-Hydroxy: 15 ng/mL — ABNORMAL LOW (ref 30.0–100.0)

## 2023-02-19 MED ORDER — METFORMIN HCL 500 MG PO TABS: 500.0000 mg | ORAL_TABLET | Freq: Two times a day (BID) | ORAL | 2 refills | Status: DC

## 2023-02-19 NOTE — Progress Notes (Signed)
A weekly vitamin D supplement prescription has been sent to your pharmacy because your vitamin D is low.  Your hemoglobin A1c is 6.2 indicating that you are currently considered prediabetic and a refill of your metformin 500 mg to take daily is sent to your pharmacy. I recommend avoiding simple carbohydrates including cakes, sweet desserts, ice cream, soda (diet or regular), sweet tea, candies, chips, cookies, store-bought juices, alcohol in excess of 1-2 drinks a day, lemonade, artificial sweeteners, donuts, coffee creamers, and sugar-free products.  I recommend avoiding greasy, fatty foods with increased physical activity.  I also recommend increasing your dietary intake of iron rich foods eggs,, spinach, broccoli, liver, lean chicken, [place], and kale.  Your creatinine albumin ratio is elevated, this is an early sign of diabetic nephropathy.  I recommend taking lisinopril 20 mg daily for your blood pressure and also to protect your kidneys from further damage.

## 2023-02-26 ENCOUNTER — Ambulatory Visit (INDEPENDENT_AMBULATORY_CARE_PROVIDER_SITE_OTHER): Payer: Medicaid Other | Admitting: Podiatry

## 2023-02-26 DIAGNOSIS — B353 Tinea pedis: Secondary | ICD-10-CM

## 2023-02-26 DIAGNOSIS — L0889 Other specified local infections of the skin and subcutaneous tissue: Secondary | ICD-10-CM | POA: Diagnosis not present

## 2023-02-26 DIAGNOSIS — L603 Nail dystrophy: Secondary | ICD-10-CM

## 2023-02-26 DIAGNOSIS — L03032 Cellulitis of left toe: Secondary | ICD-10-CM | POA: Diagnosis not present

## 2023-02-26 DIAGNOSIS — L03031 Cellulitis of right toe: Secondary | ICD-10-CM | POA: Diagnosis not present

## 2023-02-26 DIAGNOSIS — M778 Other enthesopathies, not elsewhere classified: Secondary | ICD-10-CM

## 2023-02-26 DIAGNOSIS — T63301A Toxic effect of unspecified spider venom, accidental (unintentional), initial encounter: Secondary | ICD-10-CM

## 2023-02-26 DIAGNOSIS — B351 Tinea unguium: Secondary | ICD-10-CM

## 2023-02-26 MED ORDER — DOXYCYCLINE HYCLATE 100 MG PO TABS: 100.0000 mg | ORAL_TABLET | Freq: Two times a day (BID) | ORAL | 0 refills | Status: AC

## 2023-02-26 MED ORDER — TERBINAFINE HCL 250 MG PO TABS: 250.0000 mg | ORAL_TABLET | Freq: Every day | ORAL | 0 refills | Status: DC
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
## 2023-03-02 LAB — C-PEPTIDE: C-Peptide: 1.8 ng/mL (ref 1.1–4.4)

## 2023-03-09 DIAGNOSIS — Z419 Encounter for procedure for purposes other than remedying health state, unspecified: Secondary | ICD-10-CM | POA: Diagnosis not present

## 2023-03-17 ENCOUNTER — Ambulatory Visit: Payer: Medicaid Other | Admitting: Family Medicine

## 2023-03-23 ENCOUNTER — Ambulatory Visit: Payer: Medicaid Other | Admitting: Family Medicine

## 2023-04-09 ENCOUNTER — Encounter: Payer: Self-pay | Admitting: Podiatry

## 2023-04-09 ENCOUNTER — Ambulatory Visit (INDEPENDENT_AMBULATORY_CARE_PROVIDER_SITE_OTHER): Payer: Medicaid Other | Admitting: Podiatry

## 2023-04-09 DIAGNOSIS — B351 Tinea unguium: Secondary | ICD-10-CM | POA: Diagnosis not present

## 2023-04-09 DIAGNOSIS — Z419 Encounter for procedure for purposes other than remedying health state, unspecified: Secondary | ICD-10-CM | POA: Diagnosis not present

## 2023-04-09 DIAGNOSIS — L603 Nail dystrophy: Secondary | ICD-10-CM

## 2023-04-09 MED ORDER — TERBINAFINE HCL 250 MG PO TABS: 250.0000 mg | ORAL_TABLET | Freq: Every day | ORAL | 0 refills | Status: AC

## 2023-04-09 NOTE — Progress Notes (Signed)
Has started on either medication due to insurance

## 2023-04-09 NOTE — Progress Notes (Signed)
He presents today for follow-up of his nail pathology.  Objective: Pathology demonstrates onychomycosis and nail dystrophy.  Assessment: Onychomycosis.  Plan: Discussed etiology pathology and surgical therapies at this point I started him on Lamisil 200 mg tablets 1 p.o. daily #30 follow-up with him in 1 month.

## 2023-04-10 ENCOUNTER — Ambulatory Visit (INDEPENDENT_AMBULATORY_CARE_PROVIDER_SITE_OTHER): Payer: Medicaid Other | Admitting: Professional Counselor

## 2023-04-10 DIAGNOSIS — F331 Major depressive disorder, recurrent, moderate: Secondary | ICD-10-CM
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
## 2023-04-10 NOTE — Patient Instructions (Signed)
If your symptoms worsen or you have thoughts of suicide/homicide, PLEASE SEEK IMMEDIATE MEDICAL ATTENTION.  You may always call:   National Suicide Hotline: 988 or 800-273-8255 [place] Crisis Line: 336-832-9700 Crisis Recovery in [place]: 800-939-5911      These are available 24 hours a day, 7 days a week.  

## 2023-04-14 ENCOUNTER — Ambulatory Visit: Payer: Medicaid Other | Admitting: Family Medicine

## 2023-04-15 ENCOUNTER — Telehealth (INDEPENDENT_AMBULATORY_CARE_PROVIDER_SITE_OTHER): Payer: Medicaid Other | Admitting: Professional Counselor

## 2023-04-15 DIAGNOSIS — F331 Major depressive disorder, recurrent, moderate: Secondary | ICD-10-CM
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
## 2023-04-17 ENCOUNTER — Ambulatory Visit: Payer: Self-pay | Admitting: Professional Counselor

## 2023-04-21 ENCOUNTER — Encounter: Payer: Self-pay | Admitting: Family Medicine

## 2023-05-04 ENCOUNTER — Ambulatory Visit: Payer: Medicaid Other | Admitting: Family Medicine

## 2023-05-05 ENCOUNTER — Encounter: Payer: Self-pay | Admitting: Family Medicine

## 2023-05-08 ENCOUNTER — Ambulatory Visit (INDEPENDENT_AMBULATORY_CARE_PROVIDER_SITE_OTHER): Payer: Medicaid Other | Admitting: Professional Counselor

## 2023-05-08 DIAGNOSIS — F331 Major depressive disorder, recurrent, moderate: Secondary | ICD-10-CM
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
## 2023-05-10 DIAGNOSIS — Z419 Encounter for procedure for purposes other than remedying health state, unspecified: Secondary | ICD-10-CM | POA: Diagnosis not present

## 2023-05-12 ENCOUNTER — Ambulatory Visit: Payer: Medicaid Other | Admitting: Podiatry

## 2023-05-21 NOTE — Progress Notes (Deleted)
I have reviewed and agree with the above documentation.  Gilmore Laroche, FNP [facility]

## 2023-05-25 DIAGNOSIS — H5213 Myopia, bilateral: Secondary | ICD-10-CM | POA: Diagnosis not present

## 2023-05-27 ENCOUNTER — Encounter: Payer: Self-pay | Admitting: Pharmacist

## 2023-05-28 ENCOUNTER — Ambulatory Visit: Payer: Medicaid Other | Admitting: Family Medicine

## 2023-05-29 ENCOUNTER — Ambulatory Visit: Payer: Medicaid Other | Admitting: Professional Counselor

## 2023-05-29 DIAGNOSIS — F331 Major depressive disorder, recurrent, moderate: Secondary | ICD-10-CM

## 2023-05-29 NOTE — BH Specialist Note (Signed)
[facility] Telephone Follow-up  MRN: [id number] NAME: Andrew Morales. Date: 05/29/23  Start time: Start Time: 1030 End time: Stop Time: 1100 Total time: Total Time in Minutes (Visit): 30 Call number: Visit Number: 4- Fourth Visit  Reason for call today:  The patient is a 32 year old male who returned for a collaborative care follow-up. He arrived on time, made good eye contact, and was fully engaged throughout the session. The patient reported continued improvement in maintaining a positive mood and stated that, overall, things have been much better. However, he continues to struggle with sleep, mainly due to his work schedule. Despite this, he feels more optimistic and is looking forward to the future. He shared that he has begun the process of filing for custody, which he feels good about. Additionally, the patient expressed interest in obtaining an emotional support animal, mentioning that his dogs have been very therapeutic for him. To support this, the behavioral counselor provided a letter of recommendation to an emotional support agency so that his dog can be verified as an emotional support animal. We will follow up in two weeks to continue collaborative care.  PHQ-9 Scores:     05/29/2023   10:36 AM 05/08/2023   10:55 AM 04/10/2023    9:44 AM 04/10/2023    9:40 AM 02/16/2023    1:17 PM  Depression screen PHQ 2/9  Decreased Interest 0 1 0 2 1  Down, Depressed, Hopeless 1 2  1 3   PHQ - 2 Score 1 3 0 3 4  Altered sleeping 2 2  2 2   Tired, decreased energy 1 1  0 0  Change in appetite 3 1  2 2   Feeling bad or failure about yourself  2 0  2 2  Trouble concentrating 0 0  0 0  Moving slowly or fidgety/restless 0 0  0 0  Suicidal thoughts 0 0  1 1  PHQ-9 Score 9 7  10 11   Difficult doing work/chores  Somewhat difficult   Not difficult at all   GAD-7 Scores:     05/29/2023   10:37 AM 05/08/2023   10:56 AM 04/10/2023    9:47 AM 02/16/2023    1:17 PM  GAD 7 : Generalized  Anxiety Score  Nervous, Anxious, on Edge 2 0 1 0  Control/stop worrying 1 2 2 1   Worry too much - different things 1 0 2 1  Trouble relaxing 1 0 2 0  Restless 0 0 0 0  Easily annoyed or irritable 3 3 2 3   Afraid - awful might happen 0 1 0 0  Total GAD 7 Score 8 6 9 5   Anxiety Difficulty Somewhat difficult Somewhat difficult Somewhat difficult Not difficult at all    Stress Current stressors:  Custody battle Sleep:  Fair Appetite:  Poor Coping ability:  Good Patient taking medications as prescribed:  Yes  Current medications:  Outpatient Encounter Medications as of 05/29/2023  Medication Sig   atorvastatin (LIPITOR) 10 MG tablet Take 1 tablet (10 mg total) by mouth daily.   doxycycline (VIBRA-TABS) 100 MG tablet Take 1 tablet (100 mg total) by mouth 2 (two) times daily.   lisinopril (ZESTRIL) 20 MG tablet Take 1 tablet (20 mg total) by mouth daily.   metFORMIN (GLUCOPHAGE) 500 MG tablet Take 1 tablet (500 mg total) by mouth 2 (two) times daily with a meal. (Needs to be seen before next refill)   terbinafine (LAMISIL) 250 MG tablet Take 1 tablet (250  mg total) by mouth daily.   Vitamin D, Ergocalciferol, (DRISDOL) 1.25 MG (50000 UNIT) CAPS capsule Take 1 capsule (50,000 Units total) by mouth every 7 (seven) days.   No facility-administered encounter medications on file as of 05/29/2023.     Self-harm Behaviors Risk Assessment Self-harm risk factors:  None Patient endorses recent thoughts of harming self:  Denies   Danger to Others Risk Assessment Danger to others risk factors:  None Patient endorses recent thoughts of harming others:  Denies   Goals, Interventions and Follow-up Plan Goals: Increase healthy adjustment to current life circumstances Interventions: Mindfulness or Relaxation Training and CBT Cognitive Behavioral Therapy Follow-up Plan:  2 week follow up   Andrew Morales

## 2023-06-01 ENCOUNTER — Encounter: Payer: Self-pay | Admitting: Podiatry

## 2023-06-02 ENCOUNTER — Ambulatory Visit: Payer: Medicaid Other | Admitting: Family Medicine

## 2023-06-09 DIAGNOSIS — Z419 Encounter for procedure for purposes other than remedying health state, unspecified: Secondary | ICD-10-CM | POA: Diagnosis not present

## 2023-06-11 ENCOUNTER — Ambulatory Visit: Payer: Medicaid Other | Admitting: Podiatry

## 2023-06-19 ENCOUNTER — Ambulatory Visit: Payer: Medicaid Other | Admitting: Family Medicine

## 2023-06-19 ENCOUNTER — Ambulatory Visit: Payer: Medicaid Other | Admitting: Professional Counselor

## 2023-07-10 DIAGNOSIS — Z419 Encounter for procedure for purposes other than remedying health state, unspecified: Secondary | ICD-10-CM | POA: Diagnosis not present

## 2023-07-17 ENCOUNTER — Encounter: Payer: Self-pay | Admitting: Family Medicine

## 2023-07-17 ENCOUNTER — Ambulatory Visit (HOSPITAL_COMMUNITY)
Admission: RE | Admit: 2023-07-17 | Discharge: 2023-07-17 | Disposition: A | Discharge status: Home / Self Care | Payer: Medicaid Other | Source: Ambulatory Visit | Attending: Family Medicine | Admitting: Family Medicine

## 2023-07-17 ENCOUNTER — Ambulatory Visit (INDEPENDENT_AMBULATORY_CARE_PROVIDER_SITE_OTHER): Payer: Medicaid Other | Admitting: Family Medicine

## 2023-07-17 VITALS — BP 124/84 | HR 88 | Ht 74.0 in | Wt 182.0 lb

## 2023-07-17 DIAGNOSIS — E119 Type 2 diabetes mellitus without complications: Secondary | ICD-10-CM

## 2023-07-17 DIAGNOSIS — Z7984 Long term (current) use of oral hypoglycemic drugs: Secondary | ICD-10-CM | POA: Diagnosis not present

## 2023-07-17 DIAGNOSIS — M25561 Pain in right knee: Secondary | ICD-10-CM | POA: Diagnosis not present

## 2023-07-17 DIAGNOSIS — E038 Other specified hypothyroidism: Secondary | ICD-10-CM | POA: Diagnosis not present

## 2023-07-17 DIAGNOSIS — I10 Essential (primary) hypertension: Secondary | ICD-10-CM

## 2023-07-17 DIAGNOSIS — E559 Vitamin D deficiency, unspecified: Secondary | ICD-10-CM | POA: Diagnosis not present

## 2023-07-17 DIAGNOSIS — F321 Major depressive disorder, single episode, moderate: Secondary | ICD-10-CM | POA: Diagnosis not present

## 2023-07-17 DIAGNOSIS — Z23 Encounter for immunization: Secondary | ICD-10-CM | POA: Diagnosis not present

## 2023-07-17 DIAGNOSIS — E7849 Other hyperlipidemia: Secondary | ICD-10-CM | POA: Diagnosis not present

## 2023-07-17 MED ORDER — SERTRALINE HCL 25 MG PO TABS: 25.0000 mg | ORAL_TABLET | Freq: Every day | ORAL | 1 refills | Status: DC

## 2023-07-17 MED ORDER — ACETAMINOPHEN 500 MG PO TABS: 500.0000 mg | ORAL_TABLET | Freq: Four times a day (QID) | ORAL | 1 refills | Status: AC | PRN

## 2023-07-17 NOTE — Assessment & Plan Note (Signed)
Uncontrolled Encouraged to continue taking lisinopril 20 mg daily Encouraged low-sodium diet with increased physical activity BP Readings from Last 3 Encounters:  07/17/23 124/84  02/16/23 (!) 158/88  09/12/21 (!) 155/92
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
## 2023-07-17 NOTE — Assessment & Plan Note (Signed)
Patient educated on CDC recommendation for the vaccine. Verbal consent was obtained from the patient, vaccine administered by nurse, no sign of adverse reactions noted at this time. Patient education on arm soreness and use of tylenol or ibuprofen for this patient  was discussed. Patient educated on the signs and symptoms of adverse effect and advise to contact the office if they occur.  

## 2023-07-17 NOTE — Assessment & Plan Note (Signed)
The patient was encouraged to start taking Zoloft 25 mg daily, ideally at the same time each day, with or without food. He was advised that it may take at least 2 weeks to notice initial benefits, with full effects potentially taking 6-8 weeks. The importance of not abruptly discontinuing the medication was emphasized to avoid withdrawal symptoms. A follow-up appointment has been scheduled in 6 weeks to assess the effectiveness of the treatment.

## 2023-07-17 NOTE — Patient Instructions (Addendum)
I appreciate the opportunity to provide care to you today!    Follow up:  6 weeks for depression  Labs: please stop by the lab during the week to get your blood drawn (CBC, CMP, TSH, Lipid profile, HgA1c, Vit D)  Depression Start taking Zoloft 25 mg daily Take at the same time each day, with or without food May take at least 2 weeks for benefit, and 6-8 weeks for full effect Do not abruptly discontinue to avoid withdrawal symptoms   Please start taking Tylenol 500 mg every 6 hours as needed for right knee pain. Additionally, please stop by [facility] to get an X-ray of the right knee.   For knee pain management, nonpharmacological interventions can be effective in reducing discomfort and improving mobility. Here are some recommended strategies: -RICE Method:  Rest: Avoid activities that aggravate the knee, especially high-impact exercises. Ice: Apply ice packs to the knee for 15-20 minutes every few hours to reduce swelling. Compression: Use an elastic bandage to provide gentle compression, which can help reduce swelling. Elevation: Keep the knee elevated to reduce swelling. -Physical Therapy and Exercises:Gentle strengthening and stretching exercises can help improve knee stability and reduce strain. Quadriceps and hamstring strengthening are particularly beneficial for knee support. -Weight Management:Maintaining a healthy weight can reduce stress on the knee joints, easing pain and preventing further damage. -Heat Therapy:Applying a warm compress or using a heating pad can relax muscles and increase blood flow to the area, which may relieve stiffness. -Supportive Footwear and Orthotics:Shoes with good arch support or custom orthotics can improve knee alignment and reduce joint strain, especially during walking. -Assistive Devices:A knee brace or walking aid, such as a cane, can help provide extra support and reduce pressure on the knee. -Low-Impact Activities:Engage in low-impact  exercises, such as swimming, cycling, or walking on flat surfaces, to maintain activity levels without overloading the knee. -Anti-inflammatory Diet:Incorporating anti-inflammatory foods like omega-3-rich fish, leafy greens, berries, and nuts can help reduce inflammation and support joint health. -Hydration:Staying well-hydrated supports joint lubrication, which may improve knee function and comfort.   Attached with your AVS, you will find valuable resources for self-education. I highly recommend dedicating some time to thoroughly examine them.   Please continue to a heart-healthy diet and increase your physical activities. Try to exercise for at least five days a week.    It was a pleasure to see you and I look forward to continuing to work together on your health and well-being. Please do not hesitate to call the office if you need care or have questions about your care.  In case of emergency, please visit the Emergency Department for urgent care, or contact our clinic at [phone] to schedule an appointment. We're here to help you!   Have a wonderful day and week. With Gratitude, Gilmore Laroche MSN, FNP-BC

## 2023-07-17 NOTE — Assessment & Plan Note (Signed)
Therapy will be initiated with Tylenol 500 mg every 6 hours as needed for pain relief. Imaging studies will be obtained to assess for any underlying pathology. Nonpharmacological management with RICE therapy was recommended, including:  Rest: Avoid activities that aggravate the knee, especially high-impact exercises. Ice: Apply ice packs to the knee for 15-20 minutes every few hours to reduce swelling. Compression: Use an elastic bandage for gentle compression to help reduce swelling. Elevation: Keep the knee elevated to minimize swelling

## 2023-07-20 DIAGNOSIS — E559 Vitamin D deficiency, unspecified: Secondary | ICD-10-CM | POA: Diagnosis not present

## 2023-07-20 DIAGNOSIS — E038 Other specified hypothyroidism: Secondary | ICD-10-CM | POA: Diagnosis not present

## 2023-07-20 DIAGNOSIS — E119 Type 2 diabetes mellitus without complications: Secondary | ICD-10-CM | POA: Diagnosis not present

## 2023-07-20 DIAGNOSIS — E7849 Other hyperlipidemia: Secondary | ICD-10-CM | POA: Diagnosis not present

## 2023-07-21 ENCOUNTER — Other Ambulatory Visit: Payer: Self-pay | Admitting: Family Medicine

## 2023-07-21 DIAGNOSIS — E7800 Pure hypercholesterolemia, unspecified: Secondary | ICD-10-CM

## 2023-07-21 DIAGNOSIS — E559 Vitamin D deficiency, unspecified: Secondary | ICD-10-CM

## 2023-07-21 DIAGNOSIS — E119 Type 2 diabetes mellitus without complications: Secondary | ICD-10-CM

## 2023-07-21 LAB — CMP14+EGFR
ALT: 12 [IU]/L (ref 0–44)
AST: 16 [IU]/L (ref 0–40)
Albumin: 4.6 g/dL (ref 4.1–5.1)
Alkaline Phosphatase: 123 [IU]/L — ABNORMAL HIGH (ref 44–121)
BUN/Creatinine Ratio: 9 (ref 9–20)
BUN: 18 mg/dL (ref 6–20)
Bilirubin Total: 0.5 mg/dL (ref 0.0–1.2)
CO2: 22 mmol/L (ref 20–29)
Calcium: 9.6 mg/dL (ref 8.7–10.2)
Chloride: 102 mmol/L (ref 96–106)
Creatinine, Ser: 1.95 mg/dL — ABNORMAL HIGH (ref 0.76–1.27)
Globulin, Total: 3.1 g/dL (ref 1.5–4.5)
Glucose: 129 mg/dL — ABNORMAL HIGH (ref 70–99)
Potassium: 5.1 mmol/L (ref 3.5–5.2)
Sodium: 138 mmol/L (ref 134–144)
Total Protein: 7.7 g/dL (ref 6.0–8.5)
eGFR: 46 mL/min/{1.73_m2} — ABNORMAL LOW (ref >59)

## 2023-07-21 LAB — LIPID PANEL
Chol/HDL Ratio: 4.4 ratio (ref 0.0–5.0)
Cholesterol, Total: 173 mg/dL (ref 100–199)
HDL: 39 mg/dL — ABNORMAL LOW (ref >39)
LDL Chol Calc (NIH): 114 mg/dL — ABNORMAL HIGH (ref 0–99)
Triglycerides: 111 mg/dL (ref 0–149)
VLDL Cholesterol Cal: 20 mg/dL (ref 5–40)

## 2023-07-21 LAB — CBC WITH DIFFERENTIAL/PLATELET
Basophils Absolute: 0 10*3/uL (ref 0.0–0.2)
Basos: 0 %
EOS (ABSOLUTE): 0.1 10*3/uL (ref 0.0–0.4)
Eos: 2 %
Hematocrit: 32.9 % — ABNORMAL LOW (ref 37.5–51.0)
Hemoglobin: 11 g/dL — ABNORMAL LOW (ref 13.0–17.7)
Immature Grans (Abs): 0 10*3/uL (ref 0.0–0.1)
Immature Granulocytes: 0 %
Lymphocytes Absolute: 1.2 10*3/uL (ref 0.7–3.1)
Lymphs: 20 %
MCH: 31.3 pg (ref 26.6–33.0)
MCHC: 33.4 g/dL (ref 31.5–35.7)
MCV: 94 fL (ref 79–97)
Monocytes Absolute: 0.5 10*3/uL (ref 0.1–0.9)
Monocytes: 8 %
Neutrophils Absolute: 4.2 10*3/uL (ref 1.4–7.0)
Neutrophils: 70 %
Platelets: 237 10*3/uL (ref 150–450)
RBC: 3.52 x10E6/uL — ABNORMAL LOW (ref 4.14–5.80)
RDW: 12.7 % (ref 11.6–15.4)
WBC: 6.1 10*3/uL (ref 3.4–10.8)

## 2023-07-21 LAB — VITAMIN D 25 HYDROXY (VIT D DEFICIENCY, FRACTURES): Vit D, 25-Hydroxy: 26 ng/mL — ABNORMAL LOW (ref 30.0–100.0)

## 2023-07-21 LAB — TSH+FREE T4
Free T4: 1.45 ng/dL (ref 0.82–1.77)
TSH: 1.73 u[IU]/mL (ref 0.450–4.500)

## 2023-07-21 LAB — HEMOGLOBIN A1C
Est. average glucose Bld gHb Est-mCnc: 131 mg/dL
Hgb A1c MFr Bld: 6.2 % — ABNORMAL HIGH (ref 4.8–5.6)

## 2023-07-21 MED ORDER — VITAMIN D (ERGOCALCIFEROL) 1.25 MG (50000 UNIT) PO CAPS: 50000.0000 [IU] | ORAL_CAPSULE | ORAL | 1 refills | Status: AC

## 2023-07-21 MED ORDER — METFORMIN HCL 500 MG PO TABS: 500.0000 mg | ORAL_TABLET | Freq: Two times a day (BID) | ORAL | 2 refills | Status: AC

## 2023-07-21 MED ORDER — ATORVASTATIN CALCIUM 20 MG PO TABS: 20.0000 mg | ORAL_TABLET | Freq: Every day | ORAL | 3 refills | Status: DC

## 2023-07-21 NOTE — Progress Notes (Signed)
I have increased your atorvastatin to 20 mg daily because your cholesterol levels are elevated. I recommend lifestyle modifications, including reducing your intake of greasy, fatty, and starchy foods and increasing physical activity. Your hemoglobin A1c is 6.2, so please continue taking metformin 500 mg twice daily and aim to reduce high-sugar foods and beverages, along with increased physical activity. A refill for your vitamin D supplement has been sent to your pharmacy, as your vitamin D level is slightly low. Additionally, I recommend increasing your fluid intake to at least 64 ounces daily. All other labs are stable.

## 2023-07-24 ENCOUNTER — Ambulatory Visit: Payer: Medicaid Other | Admitting: Professional Counselor

## 2023-07-24 DIAGNOSIS — F321 Major depressive disorder, single episode, moderate: Secondary | ICD-10-CM

## 2023-07-24 NOTE — BH Specialist Note (Signed)
[place] [facility] Telephone Follow-up  MRN: [id number] NAME: Andrew Morales. Date: 07/24/23  Start time: Start Time: 1000 End time: Stop Time: 1045 Total time: Total Time in Minutes (Visit): 45 Call number: Visit Number: 6-Sixth Visit  Reason for call today:  The patient is a 32 year old male who returned for a collaborative care follow-up. He reports increasing depression, feeling overwhelmed by recent challenges, including job loss, financial struggles, and a sense of failure regarding his ability to provide for his family. During the session, supportive behavioral counseling was provided to process these concerns, encourage a positive outlook, and discuss available resources. The patient is working with a Child psychotherapist to apply for food stamps and has a job interview scheduled. To support his efforts, we practiced job interview techniques to help him feel more prepared and confident.  The patient recently reconnected with his primary care physician and received a prescription for medication. He plans to start the medication tomorrow, as he was previously unable to afford it, and is hopeful that it will alleviate some of his depressive symptoms. His wife joined the session, offering support and encouragement for him to remain motivated and seek the help he needs.  Given the patient's current struggles, we agreed to meet weekly to provide consistent support as he navigates this difficult time. In the interim, we will utilize cognitive-behavioral therapy (CBT) techniques, such as behavioral activation, to help reduce depressive symptoms until his medication begins to take effect. A follow-up session is scheduled for next week to continue supporting his progress.  PHQ-9 Scores:     07/24/2023   10:13 AM 07/17/2023   10:27 AM 05/29/2023   10:36 AM 05/08/2023   10:55 AM 04/10/2023    9:44 AM  Depression screen PHQ 2/9  Decreased Interest 1 3 0 1 0  Down, Depressed, Hopeless 2 3 1 2    PHQ -  2 Score 3 6 1 3  0  Altered sleeping 0 3 2 2    Tired, decreased energy 0 1 1 1    Change in appetite 2  3 1    Feeling bad or failure about yourself  2 3 2  0   Trouble concentrating 0 1 0 0   Moving slowly or fidgety/restless 0 0 0 0   Suicidal thoughts 1 1 0 0   PHQ-9 Score 8 15 9 7    Difficult doing work/chores Somewhat difficult Not difficult at all  Somewhat difficult    GAD-7 Scores:     07/24/2023   10:14 AM 07/17/2023   10:28 AM 05/29/2023   10:37 AM 05/08/2023   10:56 AM  GAD 7 : Generalized Anxiety Score  Nervous, Anxious, on Edge 1 2 2  0  Control/stop worrying 2 0 1 2  Worry too much - different things 2 3 1  0  Trouble relaxing 0 0 1 0  Restless 0 0 0 0  Easily annoyed or irritable 3 3 3 3   Afraid - awful might happen 2 3 0 1  Total GAD 7 Score 10 11 8 6   Anxiety Difficulty Somewhat difficult Very difficult Somewhat difficult Somewhat difficult    Stress Current stressors:  Financial, job loss, kids, depression Sleep:  poor Appetite:  poor Coping ability:  poor Patient taking medications as prescribed:  NA  Current medications:  Outpatient Encounter Medications as of 07/24/2023  Medication Sig   acetaminophen (TYLENOL) 500 MG tablet Take 1 tablet (500 mg total) by mouth every 6 (six) hours as needed.   atorvastatin (LIPITOR)  20 MG tablet Take 1 tablet (20 mg total) by mouth daily.   doxycycline (VIBRA-TABS) 100 MG tablet Take 1 tablet (100 mg total) by mouth 2 (two) times daily.   lisinopril (ZESTRIL) 20 MG tablet Take 1 tablet (20 mg total) by mouth daily.   metFORMIN (GLUCOPHAGE) 500 MG tablet Take 1 tablet (500 mg total) by mouth 2 (two) times daily with a meal. (Needs to be seen before next refill)   sertraline (ZOLOFT) 25 MG tablet Take 1 tablet (25 mg total) by mouth daily.   terbinafine (LAMISIL) 250 MG tablet Take 1 tablet (250 mg total) by mouth daily.   Vitamin D, Ergocalciferol, (DRISDOL) 1.25 MG (50000 UNIT) CAPS capsule Take 1 capsule (50,000 Units  total) by mouth every 7 (seven) days.   No facility-administered encounter medications on file as of 07/24/2023.     Self-harm Behaviors Risk Assessment Self-harm risk factors:  Past thoughts Patient endorses recent thoughts of harming self:  Yes but no plan or intent Flowsheet Row Integrated Behavioral Health from 07/24/2023 in [facility] Integrated [facility]al Health from 05/29/2023 in [facility] Integrated Behavioral Health from 05/08/2023 in [facility]  C-SSRS RISK CATEGORY Low Risk No Risk No Risk        Danger to Others Risk Assessment Danger to others risk factors:  None Patient endorses recent thoughts of harming others:  Denies   Substance Use Assessment Patient recently consumed alcohol:  No  Alcohol Use Disorder Identification Test (AUDIT):     06/01/2023   11:59 AM 07/13/2023   11:14 PM  Alcohol Use Disorder Test (AUDIT)  1. How often do you have a drink containing alcohol? 0 0  3. How often do you have six or more drinks on one occasion? 0 0    Goals, Interventions and Follow-up Plan Goals: Increase healthy adjustment to current life circumstances Interventions: Motivational Interviewing, Behavioral Activation, and CBT Cognitive Behavioral Therapy Follow-up Plan:  Weekly therapy    Reuel Boom

## 2023-07-24 NOTE — Patient Instructions (Signed)
If your symptoms worsen or you have thoughts of suicide/homicide, PLEASE SEEK IMMEDIATE MEDICAL ATTENTION.  You may always call:   National Suicide Hotline: 988 or 800-273-8255 [place] Crisis Line: 336-832-9700 Crisis Recovery in [place]: 800-939-5911      These are available 24 hours a day, 7 days a week.  

## 2023-07-30 ENCOUNTER — Ambulatory Visit: Payer: Medicaid Other | Admitting: Professional Counselor

## 2023-07-30 DIAGNOSIS — F321 Major depressive disorder, single episode, moderate: Secondary | ICD-10-CM

## 2023-07-31 NOTE — BH Specialist Note (Signed)
[place] [facility] Telephone Follow-up  MRN: [id number] NAME: Andrew Morales. Date: 07/31/23  Start time: Start Time: 1000 End time: Stop Time: 1015 Total time: Total Time in Minutes (Visit): 15 Call number: Visit Number: Additional Visit  Reason for call today:  The patient is a 32 year old male who returned for a clinical care follow-up conducted virtually. He reports improved mood and a more positive outlook since starting his antidepressant three days ago, though he has not yet noticed significant changes from the medication. He shared positive news about securing a job, having completed a successful interview, and is set to begin training next week. In addition, he has other interviews scheduled as he continues to seek better opportunities.  The patient attributes some of his improved outlook to his wife's encouragement, which has been helpful as they navigate financial challenges. He reflected on feeling particularly down during the last visit due to unemployment and financial strain but expressed optimism about the progress he is making. Weekly follow-ups are planned to monitor his response to the medication and ensure he feels supported during this transitional period.  PHQ-9 Scores:     07/24/2023   10:13 AM 07/17/2023   10:27 AM 05/29/2023   10:36 AM 05/08/2023   10:55 AM 04/10/2023    9:44 AM  Depression screen PHQ 2/9  Decreased Interest 1 3 0 1 0  Down, Depressed, Hopeless 2 3 1 2    PHQ - 2 Score 3 6 1 3  0  Altered sleeping 0 3 2 2    Tired, decreased energy 0 1 1 1    Change in appetite 2  3 1    Feeling bad or failure about yourself  2 3 2  0   Trouble concentrating 0 1 0 0   Moving slowly or fidgety/restless 0 0 0 0   Suicidal thoughts 1 1 0 0   PHQ-9 Score 8 15 9 7    Difficult doing work/chores Somewhat difficult Not difficult at all  Somewhat difficult    GAD-7 Scores:     07/24/2023   10:14 AM 07/17/2023   10:28 AM 05/29/2023   10:37 AM 05/08/2023   10:56 AM   GAD 7 : Generalized Anxiety Score  Nervous, Anxious, on Edge 1 2 2  0  Control/stop worrying 2 0 1 2  Worry too much - different things 2 3 1  0  Trouble relaxing 0 0 1 0  Restless 0 0 0 0  Easily annoyed or irritable 3 3 3 3   Afraid - awful might happen 2 3 0 1  Total GAD 7 Score 10 11 8 6   Anxiety Difficulty Somewhat difficult Very difficult Somewhat difficult Somewhat difficult    Stress Current stressors:  Finances, kids, relationship Sleep:  Poor Appetite:  Poor Coping ability:  Fair Patient taking medications as prescribed:  Yes  Current medications:  Outpatient Encounter Medications as of 07/30/2023  Medication Sig   acetaminophen (TYLENOL) 500 MG tablet Take 1 tablet (500 mg total) by mouth every 6 (six) hours as needed.   atorvastatin (LIPITOR) 20 MG tablet Take 1 tablet (20 mg total) by mouth daily.   doxycycline (VIBRA-TABS) 100 MG tablet Take 1 tablet (100 mg total) by mouth 2 (two) times daily.   lisinopril (ZESTRIL) 20 MG tablet Take 1 tablet (20 mg total) by mouth daily.   metFORMIN (GLUCOPHAGE) 500 MG tablet Take 1 tablet (500 mg total) by mouth 2 (two) times daily with a meal. (Needs to be seen before next refill)  sertraline (ZOLOFT) 25 MG tablet Take 1 tablet (25 mg total) by mouth daily.   terbinafine (LAMISIL) 250 MG tablet Take 1 tablet (250 mg total) by mouth daily.   Vitamin D, Ergocalciferol, (DRISDOL) 1.25 MG (50000 UNIT) CAPS capsule Take 1 capsule (50,000 Units total) by mouth every 7 (seven) days.   No facility-administered encounter medications on file as of 07/30/2023.     Self-harm Behaviors Risk Assessment Self-harm risk factors:  Past thoughts Patient endorses recent thoughts of harming self:  Denies  Danger to Others Risk Assessment Danger to others risk factors:  None Patient endorses recent thoughts of harming others:  Denies    Goals, Interventions and Follow-up Plan Goals: Increase healthy adjustment to current life  circumstances Interventions: Mindfulness or Relaxation Training, Behavioral Activation, and CBT Cognitive Behavioral Therapy Follow-up Plan: weekly follow ups    Reuel Boom

## 2023-08-09 DIAGNOSIS — Z419 Encounter for procedure for purposes other than remedying health state, unspecified: Secondary | ICD-10-CM | POA: Diagnosis not present

## 2023-09-08 DIAGNOSIS — R935 Abnormal findings on diagnostic imaging of other abdominal regions, including retroperitoneum: Secondary | ICD-10-CM | POA: Diagnosis not present

## 2023-09-08 DIAGNOSIS — R1010 Upper abdominal pain, unspecified: Secondary | ICD-10-CM | POA: Diagnosis not present

## 2023-09-08 DIAGNOSIS — K529 Noninfective gastroenteritis and colitis, unspecified: Secondary | ICD-10-CM | POA: Diagnosis not present

## 2023-09-08 DIAGNOSIS — F17200 Nicotine dependence, unspecified, uncomplicated: Secondary | ICD-10-CM | POA: Diagnosis not present

## 2023-09-08 DIAGNOSIS — E860 Dehydration: Secondary | ICD-10-CM | POA: Diagnosis not present

## 2023-09-08 DIAGNOSIS — Z5941 Food insecurity: Secondary | ICD-10-CM | POA: Diagnosis not present

## 2023-09-08 DIAGNOSIS — E119 Type 2 diabetes mellitus without complications: Secondary | ICD-10-CM | POA: Diagnosis not present

## 2023-09-08 DIAGNOSIS — Z79899 Other long term (current) drug therapy: Secondary | ICD-10-CM | POA: Diagnosis not present

## 2023-09-08 DIAGNOSIS — I10 Essential (primary) hypertension: Secondary | ICD-10-CM | POA: Diagnosis not present

## 2023-09-08 DIAGNOSIS — R112 Nausea with vomiting, unspecified: Secondary | ICD-10-CM | POA: Diagnosis not present

## 2023-09-08 DIAGNOSIS — Z7984 Long term (current) use of oral hypoglycemic drugs: Secondary | ICD-10-CM | POA: Diagnosis not present

## 2023-09-09 DIAGNOSIS — Z419 Encounter for procedure for purposes other than remedying health state, unspecified: Secondary | ICD-10-CM | POA: Diagnosis not present

## 2023-09-11 ENCOUNTER — Ambulatory Visit (INDEPENDENT_AMBULATORY_CARE_PROVIDER_SITE_OTHER): Payer: Medicaid Other | Admitting: Family Medicine

## 2023-09-11 ENCOUNTER — Encounter: Payer: Self-pay | Admitting: Family Medicine

## 2023-09-11 ENCOUNTER — Encounter: Payer: Medicaid Other | Admitting: Professional Counselor

## 2023-09-11 VITALS — BP 138/88 | HR 86 | Ht 74.0 in | Wt 180.1 lb

## 2023-09-11 DIAGNOSIS — I10 Essential (primary) hypertension: Secondary | ICD-10-CM | POA: Diagnosis not present

## 2023-09-11 DIAGNOSIS — F321 Major depressive disorder, single episode, moderate: Secondary | ICD-10-CM

## 2023-09-11 MED ORDER — LISINOPRIL 20 MG PO TABS: 20.0000 mg | ORAL_TABLET | Freq: Every day | ORAL | 1 refills | Status: DC

## 2023-09-11 MED ORDER — SERTRALINE HCL 50 MG PO TABS: 50.0000 mg | ORAL_TABLET | Freq: Every day | ORAL | 1 refills | Status: DC

## 2023-09-11 NOTE — Assessment & Plan Note (Signed)
 Will initiate therapy on Zoloft  50 mg daily Encouraged to take the medication ideally at the same time each day, with or without food. He was advised that it may take at least 2 weeks to notice initial benefits, with full effects potentially taking 6-8 weeks. The importance of not abruptly discontinuing the medication was emphasized to avoid withdrawal symptoms. A follow-up appointment has been scheduled in 6 weeks to assess the effectiveness of the treatment. Flowsheet Row Office Visit from 09/11/2023 in [facility]  PHQ-9 Total Score 8

## 2023-09-11 NOTE — Assessment & Plan Note (Signed)
 Refill sent to his pharmacy BP Readings from Last 3 Encounters:  09/11/23 138/88  07/17/23 124/84  02/16/23 (!) 158/88
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
## 2023-09-11 NOTE — Patient Instructions (Signed)
 I appreciate the opportunity to provide care to you today!    Follow up:  6 weeks   Depression Start taking Zoloft  50 mg daily Take at the same time each day, with or without food May take at least 2 weeks for benefit, and 6-8 weeks for full effect Do not abruptly discontinue to avoid withdrawal symptoms     Please continue to a heart-healthy diet and increase your physical activities. Try to exercise for at least five days a week.    It was a pleasure to see you and I look forward to continuing to work together on your health and well-being. Please do not hesitate to call the office if you need care or have questions about your care.  In case of emergency, please visit the Emergency Department for urgent care, or contact our clinic at [phone] to schedule an appointment. We're here to help you!   Have a wonderful day and week. With Gratitude, Nekisha Mcdiarmid MSN, FNP-BC

## 2023-10-01 ENCOUNTER — Encounter: Payer: Medicaid Other | Admitting: Professional Counselor

## 2023-10-10 DIAGNOSIS — Z419 Encounter for procedure for purposes other than remedying health state, unspecified: Secondary | ICD-10-CM | POA: Diagnosis not present

## 2023-10-26 ENCOUNTER — Ambulatory Visit: Payer: Medicaid Other | Admitting: Family Medicine

## 2023-11-07 DIAGNOSIS — Z419 Encounter for procedure for purposes other than remedying health state, unspecified: Secondary | ICD-10-CM | POA: Diagnosis not present

## 2023-12-04 DIAGNOSIS — I10 Essential (primary) hypertension: Secondary | ICD-10-CM | POA: Diagnosis not present

## 2023-12-04 DIAGNOSIS — I493 Ventricular premature depolarization: Secondary | ICD-10-CM | POA: Diagnosis not present

## 2023-12-04 DIAGNOSIS — K529 Noninfective gastroenteritis and colitis, unspecified: Secondary | ICD-10-CM | POA: Diagnosis not present

## 2023-12-04 DIAGNOSIS — E119 Type 2 diabetes mellitus without complications: Secondary | ICD-10-CM | POA: Diagnosis not present

## 2023-12-04 DIAGNOSIS — R109 Unspecified abdominal pain: Secondary | ICD-10-CM | POA: Diagnosis not present

## 2023-12-04 DIAGNOSIS — Z5941 Food insecurity: Secondary | ICD-10-CM | POA: Diagnosis not present

## 2023-12-17 ENCOUNTER — Ambulatory Visit (INDEPENDENT_AMBULATORY_CARE_PROVIDER_SITE_OTHER): Admitting: Professional Counselor

## 2023-12-17 DIAGNOSIS — F321 Major depressive disorder, single episode, moderate: Secondary | ICD-10-CM
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
## 2023-12-19 DIAGNOSIS — Z419 Encounter for procedure for purposes other than remedying health state, unspecified: Secondary | ICD-10-CM | POA: Diagnosis not present

## 2023-12-24 ENCOUNTER — Ambulatory Visit: Admitting: Professional Counselor

## 2024-01-05 ENCOUNTER — Encounter: Payer: Self-pay | Admitting: Family Medicine

## 2024-01-07 NOTE — Telephone Encounter (Signed)
 Place referral to Psychiatry - comment: [facility]

## 2024-01-08 ENCOUNTER — Other Ambulatory Visit: Payer: Self-pay | Admitting: Medical Genetics

## 2024-01-11 ENCOUNTER — Other Ambulatory Visit (HOSPITAL_COMMUNITY)
Admission: RE | Admit: 2024-01-11 | Discharge: 2024-01-11 | Disposition: A | Discharge status: Home / Self Care | Payer: Self-pay | Source: Ambulatory Visit | Attending: Medical Genetics | Admitting: Medical Genetics

## 2024-01-13 ENCOUNTER — Other Ambulatory Visit: Payer: Self-pay

## 2024-01-13 DIAGNOSIS — F331 Major depressive disorder, recurrent, moderate: Secondary | ICD-10-CM

## 2024-01-13 DIAGNOSIS — F321 Major depressive disorder, single episode, moderate: Secondary | ICD-10-CM

## 2024-01-13 NOTE — Telephone Encounter (Signed)
 Order placed per your request .  Awaiting cosign.  Thank you.

## 2024-01-18 DIAGNOSIS — Z419 Encounter for procedure for purposes other than remedying health state, unspecified: Secondary | ICD-10-CM | POA: Diagnosis not present

## 2024-01-22 LAB — GENECONNECT MOLECULAR SCREEN: Genetic Analysis Overall Interpretation: NEGATIVE

## 2024-02-15 ENCOUNTER — Telehealth: Payer: Self-pay | Admitting: Family Medicine

## 2024-02-15 NOTE — Telephone Encounter (Signed)
 Patient came by office today in regard to [facility] services. Patient wants a new referral placed for [facility] services.  Does not want to continue with in office [facility] counselor

## 2024-02-16 ENCOUNTER — Other Ambulatory Visit: Payer: Self-pay

## 2024-02-16 DIAGNOSIS — F321 Major depressive disorder, single episode, moderate: Secondary | ICD-10-CM

## 2024-02-16 NOTE — Telephone Encounter (Signed)
 At this time no new Referral has been placed and I cannot send previous Referral as it had a schedule by 02/12/2024 date.  Please place new referral and I will send to [facility] as Patient/Provider reqs.

## 2024-02-17 NOTE — Telephone Encounter (Signed)
 Referral has been sent to [facility] as requested at this time.

## 2024-02-18 DIAGNOSIS — Z419 Encounter for procedure for purposes other than remedying health state, unspecified: Secondary | ICD-10-CM | POA: Diagnosis not present

## 2024-03-10 ENCOUNTER — Other Ambulatory Visit: Payer: Self-pay | Admitting: Family Medicine

## 2024-03-10 DIAGNOSIS — F321 Major depressive disorder, single episode, moderate: Secondary | ICD-10-CM

## 2024-03-19 DIAGNOSIS — Z419 Encounter for procedure for purposes other than remedying health state, unspecified: Secondary | ICD-10-CM | POA: Diagnosis not present

## 2024-04-19 DIAGNOSIS — Z419 Encounter for procedure for purposes other than remedying health state, unspecified: Secondary | ICD-10-CM | POA: Diagnosis not present

## 2024-05-19 ENCOUNTER — Ambulatory Visit: Admitting: Family Medicine

## 2024-05-20 DIAGNOSIS — Z419 Encounter for procedure for purposes other than remedying health state, unspecified: Secondary | ICD-10-CM | POA: Diagnosis not present

## 2024-06-19 DIAGNOSIS — Z419 Encounter for procedure for purposes other than remedying health state, unspecified: Secondary | ICD-10-CM | POA: Diagnosis not present

## 2024-07-20 DIAGNOSIS — Z419 Encounter for procedure for purposes other than remedying health state, unspecified: Secondary | ICD-10-CM | POA: Diagnosis not present

## 2024-09-19 ENCOUNTER — Telehealth: Admitting: Family Medicine

## 2024-09-19 DIAGNOSIS — R7301 Impaired fasting glucose: Secondary | ICD-10-CM | POA: Diagnosis not present

## 2024-09-19 DIAGNOSIS — E559 Vitamin D deficiency, unspecified: Secondary | ICD-10-CM

## 2024-09-19 DIAGNOSIS — I10 Essential (primary) hypertension: Secondary | ICD-10-CM

## 2024-09-19 DIAGNOSIS — E039 Hypothyroidism, unspecified: Secondary | ICD-10-CM

## 2024-09-19 DIAGNOSIS — E7800 Pure hypercholesterolemia, unspecified: Secondary | ICD-10-CM

## 2024-09-19 DIAGNOSIS — E038 Other specified hypothyroidism: Secondary | ICD-10-CM

## 2024-09-19 DIAGNOSIS — F321 Major depressive disorder, single episode, moderate: Secondary | ICD-10-CM

## 2024-09-19 MED ORDER — SERTRALINE HCL 50 MG PO TABS: 50.0000 mg | ORAL_TABLET | Freq: Every day | ORAL | 1 refills | Status: AC

## 2024-09-19 MED ORDER — LISINOPRIL 20 MG PO TABS: 20.0000 mg | ORAL_TABLET | Freq: Every day | ORAL | 1 refills | Status: AC

## 2024-09-19 MED ORDER — ATORVASTATIN CALCIUM 20 MG PO TABS: 20.0000 mg | ORAL_TABLET | Freq: Every day | ORAL | 3 refills | Status: AC

## 2024-09-19 NOTE — Progress Notes (Signed)
 "  Virtual Visit via Video Note  I connected with Andrew Morales. on 09/19/2024 at  9:20 AM EST by a video enabled telemedicine application and verified that I am speaking with the correct person using two identifiers.  Patient Location: Home Provider Location: Home Office  I discussed the limitations, risks, security, and privacy concerns of performing an evaluation and management service by video and the availability of in person appointments. I also discussed with the patient that there may be a patient responsible charge related to this service. The patient expressed understanding and agreed to proceed.  Subjective: PCP: Edman Meade PEDLAR, FNP  Chief Complaint  Patient presents with   Medical Management of Chronic Issues    Follow up    HPI  The patient presents today for management of chronic conditions and reports no complaints or concerns at this time. He reports adherence to her treatment regimen with no side effects or adverse effects from his medications.  ROS: Per HPI Current Medications[1]  Observations/Objective: There were no vitals filed for this visit. Physical Exam Patient is well-developed, well-nourished in no acute distress.  Resting comfortably at home.  Head is normocephalic, atraumatic.  No labored breathing.  Speech is clear and coherent with logical content.  Patient is alert and oriented at baseline.   Assessment and Plan: HYPERCHOLESTEROLEMIA -     Atorvastatin  Calcium ; Take 1 tablet (20 mg total) by mouth daily.  Dispense: 90 tablet; Refill: 3 -     Lipid panel -     CMP14+EGFR -     CBC with Differential/Platelet  Essential hypertension -     Lisinopril ; Take 1 tablet (20 mg total) by mouth daily.  Dispense: 90 tablet; Refill: 1  Depression, major, single episode, moderate (HCC) -     Sertraline  HCl; Take 1 tablet (50 mg total) by mouth daily.  Dispense: 60 tablet; Refill: 1  IFG (impaired fasting glucose) -     Hemoglobin A1c  Vitamin  D deficiency -     VITAMIN D  25 Hydroxy (Vit-D Deficiency, Fractures)  TSH (thyroid -stimulating hormone deficiency) -     TSH + free T4   Encouraged to continue treatment regimen as is Follow Up Instructions: Return in about 5 months (around 02/17/2025).   I discussed the assessment and treatment plan with the patient. The patient was provided an opportunity to ask questions, and all were answered. The patient agreed with the plan and demonstrated an understanding of the instructions.   The patient was advised to call back or seek an in-person evaluation if the symptoms worsen or if the condition fails to improve as anticipated.  The above assessment and management plan was discussed with the patient. The patient verbalized understanding of and has agreed to the management plan.   [name]  Z Bacchus, FNP     [1]  Current Outpatient Medications:    acetaminophen  (TYLENOL ) 500 MG tablet, Take 1 tablet (500 mg total) by mouth every 6 (six) hours as needed., Disp: 60 tablet, Rfl: 1   atorvastatin  (LIPITOR) 20 MG tablet, Take 1 tablet (20 mg total) by mouth daily., Disp: 90 tablet, Rfl: 3   doxycycline  (VIBRA -TABS) 100 MG tablet, Take 1 tablet (100 mg total) by mouth 2 (two) times daily., Disp: 20 tablet, Rfl: 0   lisinopril  (ZESTRIL ) 20 MG tablet, Take 1 tablet (20 mg total) by mouth daily., Disp: 90 tablet, Rfl: 1   metFORMIN  (GLUCOPHAGE ) 500 MG tablet, Take 1 tablet (500 mg total) by  mouth 2 (two) times daily with a meal. (Needs to be seen before next refill), Disp: 60 tablet, Rfl: 2   sertraline  (ZOLOFT ) 50 MG tablet, Take 1 tablet (50 mg total) by mouth daily., Disp: 60 tablet, Rfl: 1   terbinafine  (LAMISIL ) 250 MG tablet, Take 1 tablet (250 mg total) by mouth daily., Disp: 30 tablet, Rfl: 0   Vitamin D , Ergocalciferol , (DRISDOL ) 1.25 MG (50000 UNIT) CAPS capsule, Take 1 capsule (50,000 Units total) by mouth every 7 (seven) days., Disp: 20 capsule, Rfl: 1  "

## 2025-02-20 ENCOUNTER — Ambulatory Visit: Payer: Self-pay
